# Patient Record
Sex: Female | Born: 1985 | Race: Black or African American | Hispanic: No | Marital: Single | Smoking: Former smoker
Health system: Southern US, Community
[De-identification: ages and names within clinical notes are randomized; demographics above are authoritative.]

## PROBLEM LIST (undated history)

## (undated) DIAGNOSIS — N390 Urinary tract infection, site not specified: Secondary | ICD-10-CM

## (undated) HISTORY — PX: TONSILLECTOMY: SUR1361

---
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
## 2008-04-07 ENCOUNTER — Emergency Department (HOSPITAL_COMMUNITY): Admission: EM | Admit: 2008-04-07 | Discharge: 2008-04-07 | Payer: Self-pay | Admitting: Emergency Medicine

## 2008-06-13 ENCOUNTER — Inpatient Hospital Stay (HOSPITAL_COMMUNITY): Admission: AD | Admit: 2008-06-13 | Discharge: 2008-06-13 | Payer: Self-pay | Admitting: Obstetrics

## 2008-06-25 ENCOUNTER — Ambulatory Visit (HOSPITAL_COMMUNITY): Admission: RE | Admit: 2008-06-25 | Discharge: 2008-06-25 | Payer: Self-pay | Admitting: Obstetrics & Gynecology

## 2008-09-15 ENCOUNTER — Ambulatory Visit (HOSPITAL_COMMUNITY): Admission: RE | Admit: 2008-09-15 | Discharge: 2008-09-15 | Payer: Self-pay | Admitting: Obstetrics

## 2008-10-09 ENCOUNTER — Emergency Department (HOSPITAL_COMMUNITY): Admission: EM | Admit: 2008-10-09 | Discharge: 2008-10-09 | Payer: Self-pay | Admitting: Emergency Medicine

## 2008-10-18 ENCOUNTER — Ambulatory Visit: Payer: Self-pay | Admitting: Obstetrics and Gynecology

## 2008-10-18 ENCOUNTER — Inpatient Hospital Stay (HOSPITAL_COMMUNITY): Admission: AD | Admit: 2008-10-18 | Discharge: 2008-10-18 | Payer: Self-pay | Admitting: Obstetrics & Gynecology

## 2008-10-26 ENCOUNTER — Emergency Department (HOSPITAL_COMMUNITY): Admission: EM | Admit: 2008-10-26 | Discharge: 2008-10-26 | Payer: Self-pay | Admitting: Emergency Medicine

## 2008-11-14 ENCOUNTER — Inpatient Hospital Stay (HOSPITAL_COMMUNITY): Admission: AD | Admit: 2008-11-14 | Discharge: 2008-11-15 | Payer: Self-pay | Admitting: Obstetrics

## 2008-11-15 ENCOUNTER — Inpatient Hospital Stay (HOSPITAL_COMMUNITY): Admission: AD | Admit: 2008-11-15 | Discharge: 2008-11-15 | Payer: Self-pay | Admitting: Obstetrics & Gynecology

## 2008-11-16 ENCOUNTER — Inpatient Hospital Stay (HOSPITAL_COMMUNITY): Admission: AD | Admit: 2008-11-16 | Discharge: 2008-11-18 | Payer: Self-pay | Admitting: Obstetrics & Gynecology

## 2009-05-15 ENCOUNTER — Emergency Department (HOSPITAL_COMMUNITY): Admission: EM | Admit: 2009-05-15 | Discharge: 2009-05-15 | Payer: Self-pay | Admitting: Emergency Medicine

## 2009-12-24 ENCOUNTER — Emergency Department (HOSPITAL_COMMUNITY): Admission: EM | Admit: 2009-12-24 | Discharge: 2009-12-24 | Payer: Self-pay | Admitting: Emergency Medicine

## 2010-04-04 ENCOUNTER — Inpatient Hospital Stay (HOSPITAL_COMMUNITY): Admission: AD | Admit: 2010-04-04 | Discharge: 2010-04-04 | Payer: Self-pay | Admitting: Family Medicine

## 2010-04-04 ENCOUNTER — Ambulatory Visit: Payer: Self-pay | Admitting: Obstetrics and Gynecology

## 2010-06-02 ENCOUNTER — Ambulatory Visit (HOSPITAL_COMMUNITY): Admission: RE | Admit: 2010-06-02 | Discharge: 2010-06-02 | Payer: Self-pay | Admitting: Obstetrics

## 2010-07-11 ENCOUNTER — Inpatient Hospital Stay (HOSPITAL_COMMUNITY)
Admission: AD | Admit: 2010-07-11 | Discharge: 2010-07-11 | Payer: Self-pay | Source: Home / Self Care | Attending: Obstetrics & Gynecology | Admitting: Obstetrics & Gynecology

## 2010-07-22 ENCOUNTER — Inpatient Hospital Stay (HOSPITAL_COMMUNITY)
Admission: AD | Admit: 2010-07-22 | Discharge: 2010-07-22 | Payer: Self-pay | Source: Home / Self Care | Attending: Obstetrics | Admitting: Obstetrics

## 2010-07-31 LAB — URINALYSIS, ROUTINE W REFLEX MICROSCOPIC
Bilirubin Urine: NEGATIVE
Hgb urine dipstick: NEGATIVE
Ketones, ur: >80 mg/dL — AB
Nitrite: NEGATIVE
Protein, ur: NEGATIVE mg/dL
Specific Gravity, Urine: 1.02 (ref 1.005–1.030)
Urine Glucose, Fasting: NEGATIVE mg/dL
Urobilinogen, UA: 0.2 mg/dL (ref 0.0–1.0)
pH: 7 (ref 5.0–8.0)

## 2010-09-25 LAB — URINALYSIS, ROUTINE W REFLEX MICROSCOPIC
Bilirubin Urine: NEGATIVE
Glucose, UA: NEGATIVE mg/dL
Hgb urine dipstick: NEGATIVE
Ketones, ur: NEGATIVE mg/dL
Nitrite: NEGATIVE
Protein, ur: NEGATIVE mg/dL
Specific Gravity, Urine: >1.03 — ABNORMAL HIGH (ref 1.005–1.030)
Urobilinogen, UA: 0.2 mg/dL (ref 0.0–1.0)
pH: 6.5 (ref 5.0–8.0)

## 2010-09-25 LAB — WET PREP, GENITAL
Trich, Wet Prep: NONE SEEN
Yeast Wet Prep HPF POC: NONE SEEN

## 2010-09-25 LAB — CBC
HCT: 32 % — ABNORMAL LOW (ref 36.0–46.0)
Hemoglobin: 10.8 g/dL — ABNORMAL LOW (ref 12.0–15.0)
MCH: 28.1 pg (ref 26.0–34.0)
MCHC: 33.8 g/dL (ref 30.0–36.0)
MCV: 83.3 fL (ref 78.0–100.0)
Platelets: 240 10*3/uL (ref 150–400)
RBC: 3.84 MIL/uL — ABNORMAL LOW (ref 3.87–5.11)
RDW: 13.7 % (ref 11.5–15.5)
WBC: 12.9 10*3/uL — ABNORMAL HIGH (ref 4.0–10.5)

## 2010-09-28 LAB — POCT PREGNANCY, URINE: Preg Test, Ur: POSITIVE

## 2010-09-28 LAB — CBC
HCT: 33.1 % — ABNORMAL LOW (ref 36.0–46.0)
Hemoglobin: 11 g/dL — ABNORMAL LOW (ref 12.0–15.0)
MCH: 29.1 pg (ref 26.0–34.0)
MCHC: 33.3 g/dL (ref 30.0–36.0)
MCV: 87.2 fL (ref 78.0–100.0)
Platelets: 290 10*3/uL (ref 150–400)
RBC: 3.8 MIL/uL — ABNORMAL LOW (ref 3.87–5.11)
RDW: 13 % (ref 11.5–15.5)
WBC: 11.8 10*3/uL — ABNORMAL HIGH (ref 4.0–10.5)

## 2010-09-28 LAB — URINALYSIS, ROUTINE W REFLEX MICROSCOPIC
Bilirubin Urine: NEGATIVE
Glucose, UA: NEGATIVE mg/dL
Hgb urine dipstick: NEGATIVE
Ketones, ur: 15 mg/dL — AB
Nitrite: NEGATIVE
Protein, ur: NEGATIVE mg/dL
Specific Gravity, Urine: 1.025 (ref 1.005–1.030)
Urobilinogen, UA: 0.2 mg/dL (ref 0.0–1.0)
pH: 6 (ref 5.0–8.0)

## 2010-09-28 LAB — ABO/RH: ABO/RH(D): O POS

## 2010-09-28 LAB — GC/CHLAMYDIA PROBE AMP, GENITAL
Chlamydia, DNA Probe: NEGATIVE
GC Probe Amp, Genital: NEGATIVE

## 2010-09-28 LAB — WET PREP, GENITAL
Trich, Wet Prep: NONE SEEN
Yeast Wet Prep HPF POC: NONE SEEN

## 2010-09-29 ENCOUNTER — Inpatient Hospital Stay (HOSPITAL_COMMUNITY)
Admission: AD | Admit: 2010-09-29 | Discharge: 2010-09-29 | Disposition: A | Discharge status: Home / Self Care | Payer: Medicaid Other | Source: Ambulatory Visit | Attending: Obstetrics | Admitting: Obstetrics

## 2010-09-29 DIAGNOSIS — O368190 Decreased fetal movements, unspecified trimester, not applicable or unspecified: Secondary | ICD-10-CM | POA: Insufficient documentation

## 2010-10-02 LAB — URINALYSIS, ROUTINE W REFLEX MICROSCOPIC
Bilirubin Urine: NEGATIVE
Glucose, UA: NEGATIVE mg/dL
Ketones, ur: NEGATIVE mg/dL
Nitrite: NEGATIVE
Protein, ur: 100 mg/dL — AB
Specific Gravity, Urine: 1.017 (ref 1.005–1.030)
Urobilinogen, UA: 1 mg/dL (ref 0.0–1.0)
pH: 7 (ref 5.0–8.0)

## 2010-10-02 LAB — URINE MICROSCOPIC-ADD ON

## 2010-10-02 LAB — URINE CULTURE: Colony Count: >=100000

## 2010-10-03 ENCOUNTER — Inpatient Hospital Stay (HOSPITAL_COMMUNITY)
Admission: AD | Admit: 2010-10-03 | Discharge: 2010-10-06 | DRG: 766 | Disposition: A | Discharge status: Home / Self Care | Payer: Medicaid Other | Source: Ambulatory Visit | Attending: Obstetrics | Admitting: Obstetrics

## 2010-10-03 DIAGNOSIS — O480 Post-term pregnancy: Principal | ICD-10-CM | POA: Diagnosis present

## 2010-10-03 LAB — CBC
HCT: 34.2 % — ABNORMAL LOW (ref 36.0–46.0)
Hemoglobin: 11.4 g/dL — ABNORMAL LOW (ref 12.0–15.0)
MCH: 27.6 pg (ref 26.0–34.0)
MCHC: 33.3 g/dL (ref 30.0–36.0)
MCV: 82.8 fL (ref 78.0–100.0)
Platelets: 251 10*3/uL (ref 150–400)
RBC: 4.13 MIL/uL (ref 3.87–5.11)
RDW: 14.4 % (ref 11.5–15.5)
WBC: 13.1 10*3/uL — ABNORMAL HIGH (ref 4.0–10.5)

## 2010-10-04 ENCOUNTER — Other Ambulatory Visit: Payer: Self-pay | Admitting: Obstetrics & Gynecology

## 2010-10-04 LAB — RPR: RPR Ser Ql: NONREACTIVE

## 2010-10-05 LAB — CBC
HCT: 29.7 % — ABNORMAL LOW (ref 36.0–46.0)
Hemoglobin: 9.5 g/dL — ABNORMAL LOW (ref 12.0–15.0)
MCH: 27 pg (ref 26.0–34.0)
MCHC: 32 g/dL (ref 30.0–36.0)
MCV: 84.4 fL (ref 78.0–100.0)
Platelets: 206 10*3/uL (ref 150–400)
RBC: 3.52 MIL/uL — ABNORMAL LOW (ref 3.87–5.11)
RDW: 14.6 % (ref 11.5–15.5)
WBC: 10.5 10*3/uL (ref 4.0–10.5)

## 2010-10-12 NOTE — Discharge Summary (Signed)
  NAME:  Jacqueline Haas, Jacqueline Haas NO.:  [id number]  MEDICAL RECORD NO.:  [id number]           PATIENT TYPE:  I  LOCATION:  9107                          FACILITY:  [facility]  PHYSICIAN:  Roseanna Rainbow, M.D.DATE OF BIRTH:  [birth year]  DATE OF ADMISSION:  10/03/2010 DATE OF DISCHARGE:  10/06/2010                              DISCHARGE SUMMARY   CHIEF COMPLAINT:  The patient is a 25 year old gravida 3, para 1 with an estimated date of confinement of September 26, 2010, who presents for induction of labor secondary to postdates.  PAST SURGICAL HISTORY:  Tonsils and adenoids.  PAST MEDICAL HISTORY:  She denies.  MEDICATIONS:  Prenatal vitamins, Diflucan, Tylenol.  ALLERGIES:  To MORPHINE DERIVATIVES.  SOCIAL HISTORY:  Current tobacco use.  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  FAMILY HISTORY:  Depression, hypertension.  PHYSICAL EXAMINATION:  VITAL SIGNS:  Stable and afebrile. LUNGS:  Clear to auscultation bilaterally. HEART:  Regular rate and rhythm. ABDOMEN:  Gravid and nontender.  Cervix long and closed.  Vertex at -3 station.  ASSESSMENT AND PLAN:  Intrauterine pregnancy at 41 weeks.  PLAN:  Admission, two-stage induction of labor.  HOSPITAL COURSE:  The patient was admitted.  The fetal heart tracing evolved into a category II fetal heart tracing.  The fetal heart tracing had persistent suspicious changes that were refractory to intrauterine resuscitative efforts.  The patient was in latent labor and delivery was felt to be remote.  The decision was made to proceed with a primary cesarean delivery.  Please see the dictated operative summary for findings.  On postoperative day #1, hemoglobin was 9.5 and preoperative hemoglobin was 11.4.  The patient was hemodynamically stable.  The remainder of her hospital course was uneventful.  She was discharged to home on postoperative day #2.  DISCHARGE DIAGNOSES:  Postdates pregnancy intrauterine pregnancy  at 41 weeks, suspicious fetal heart tracing, category 2.  PROCEDURE:  Cesarean delivery.  CONDITION:  Stable.  DIET:  Regular.  ACTIVITY:  Pelvic rest, progressive activity.  MEDICATIONS:  Percocet 5/325 one to two tablets every 6 hours as needed. Over-the-counter ibuprofen, stool softener ferrous sulfate.  DISPOSITION:  The patient was to follow up in the office in several days for staple removal.     Roseanna Rainbow, M.D.     LAJ/MEDQ  D:  10/06/2010  T:  10/07/2010  Job:  [id number]  Electronically Signed by Antionette Char M.D. on 10/12/2010 07:11:46 PM

## 2010-10-12 NOTE — Op Note (Signed)
NAME:  Jacqueline Haas, Jacqueline Haas NO.:  [id number]  MEDICAL RECORD NO.:  [id number]         PATIENT TYPE:  WINP  LOCATION:  107                           [facility]:  [facility]  PHYSICIAN:  Roseanna Rainbow, M.D.DATE OF BIRTH:  04-16-1986  DATE OF PROCEDURE:  10/04/2010 DATE OF DISCHARGE:                              OPERATIVE REPORT   PREOPERATIVE DIAGNOSES:  Intrauterine pregnancy at 41 weeks, latent labor.  Category 2 fetal heart tracing.  POSTOPERATIVE DIAGNOSIS:  Intrauterine pregnancy at 41 weeks, latent labor.  Category 2 fetal heart tracing.  PROCEDURE:  Primary low uterine flap elliptical cesarean delivery via a transverse incision.  SURGEON:  Roseanna Rainbow, MD  ANESTHESIA:  Epidural.  FINDINGS:  Live born female, vertex presentation, Apgars 8 and 9 at 1 and 5 minutes respectively, umbilical artery pH 7.29, birth weight 8 pounds 3 ounces.  PATHOLOGY:  Placenta.  ESTIMATED BLOOD LOSS:  600 mL.  COMPLICATIONS:  None.  PROCEDURE:  The patient was taken to the operating room with an IV running.  An epidural catheter was in situ.  She was placed in the dorsal supine position with a leftward tilt and prepped and draped in the usual sterile fashion.  After a time-out had been completed, a transverse incision was made with the scalpel.  This was carried down to the underlying fascia with Bovie.  The fascia was nicked in the midline. The fascial incision was then extended bilaterally with curved Mayo scissors.  The superior aspect of the fascial incision was tented up and the underlying rectus muscle was dissected off.  The inferior aspect of the fascial incision was manipulated in a similar fashion.  The rectus muscles were separated in the midline.  The parietal peritoneum was tented up and entered sharply.  This incision was then extended superiorly and inferiorly with good visualization of the bladder.  The bladder blade was then placed.  The  vesicouterine peritoneum was tented up and entered sharply.  This incision was then extended bilaterally and the bladder flap created bluntly.  The bladder blade was then replaced. The lower uterine segment was incised in a transverse fashion with a scalpel.  This incision was then extended bluntly.  The infant's head was delivered atraumatically.  The cord was clamped and cut.  The infant was handed off to the awaiting neonatologist.  The placenta was then removed.  The intrauterine cavity was evacuated of any remaining amniotic fluid clots and debris with moist laparotomy sponge.  The uterine incision was then reapproximated in a running interlocking fashion using a suture of 0 Monocryl.  The second imbricating layer of the same suture was then placed for adequate hemostasis.  The paracolic gutters were then irrigated.  The parietal peritoneum was then reapproximated in a running fashion using a suture of 2-0 Vicryl.  The fascia was reapproximated in a running fashion using 2 running sutures of 0 Vicryl tied in the midline.  The skin was closed with staples.  At the closure of the procedure, the instrument and pack counts were said to be correct x2.  The patient was taken to the PACU in awake and stable  condition.     Roseanna Rainbow, M.D.     Judee Clara  D:  10/04/2010  T:  10/05/2010  Job:  [id number]  Electronically Signed by Antionette Char M.D. on 10/12/2010 07:11:32 PM

## 2010-10-24 LAB — CBC
HCT: 30.5 % — ABNORMAL LOW (ref 36.0–46.0)
Hemoglobin: 10.3 g/dL — ABNORMAL LOW (ref 12.0–15.0)
Hemoglobin: 11.8 g/dL — ABNORMAL LOW (ref 12.0–15.0)
RBC: 3.53 MIL/uL — ABNORMAL LOW (ref 3.87–5.11)
RDW: 15.5 % (ref 11.5–15.5)
WBC: 11.1 10*3/uL — ABNORMAL HIGH (ref 4.0–10.5)
WBC: 12.3 10*3/uL — ABNORMAL HIGH (ref 4.0–10.5)

## 2010-10-24 LAB — LACTATE DEHYDROGENASE: LDH: 128 U/L (ref 94–250)

## 2010-10-24 LAB — COMPREHENSIVE METABOLIC PANEL
ALT: 15 U/L (ref 0–35)
Albumin: 2.6 g/dL — ABNORMAL LOW (ref 3.5–5.2)
Alkaline Phosphatase: 117 U/L (ref 39–117)
Chloride: 106 mEq/L (ref 96–112)
Glucose, Bld: 80 mg/dL (ref 70–99)
Potassium: 3.9 mEq/L (ref 3.5–5.1)
Sodium: 134 mEq/L — ABNORMAL LOW (ref 135–145)
Total Bilirubin: 0.4 mg/dL (ref 0.3–1.2)
Total Protein: 5.8 g/dL — ABNORMAL LOW (ref 6.0–8.3)

## 2010-10-24 LAB — WET PREP, GENITAL

## 2010-10-24 LAB — CCBB MATERNAL DONOR DRAW

## 2010-10-25 LAB — URINALYSIS, ROUTINE W REFLEX MICROSCOPIC
Bilirubin Urine: NEGATIVE
Hgb urine dipstick: NEGATIVE
Nitrite: NEGATIVE
Specific Gravity, Urine: 1.02 (ref 1.005–1.030)
pH: 6.5 (ref 5.0–8.0)

## 2010-10-25 LAB — URINE MICROSCOPIC-ADD ON

## 2010-10-25 LAB — CBC
Hemoglobin: 11.2 g/dL — ABNORMAL LOW (ref 12.0–15.0)
RBC: 3.95 MIL/uL (ref 3.87–5.11)
RDW: 14.1 % (ref 11.5–15.5)

## 2010-11-28 NOTE — H&P (Signed)
NAME:  Jacqueline Haas, Jacqueline Haas NO.:  [id number]   MEDICAL RECORD NO.:  [id number]          PATIENT TYPE:  INP   LOCATION:  9136                          [facility]ACILITY:  [facility]   PHYSICIAN:  Roseanna Rainbow, M.D.DATE OF BIRTH:  02/01/1986   DATE OF ADMISSION:  11/16/2008  DATE OF DISCHARGE:                              HISTORY & PHYSICAL   CHIEF COMPLAINT:  The patient is a 25 year old, para 0 with an estimated  date of confinement of Nov 13, 2008, with an intrauterine pregnancy of  40+ weeks, complaining of contractions.   HISTORY OF PRESENT ILLNESS:  Please see the above.  She denies ruptured  membranes.   ALLERGIES:  MORPHINE.   MEDICATIONS:  Prenatal vitamins.   PRENATAL LABS:  Chlamydia probe negative.  Urine culture and  sensitivity, insignificant growth.  One-hour GCT 88, GC probe negative.  Hematocrit 32.8, hemoglobin 10.6, HIV nonreactive.  Quad screen  negative.  Platelets 298,000.  Blood type is O+, antibody screen  negative.  RPR nonreactive, rubella immune, sickle cell negative.  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   PAST SURGICAL HISTORY:  There is a history of a voluntary termination of  pregnancy.   PAST GYN HISTORY:  Noncontributory.   PAST MEDICAL HISTORY:  No significant history of medical diseases.   PAST SURGICAL HISTORY:  Tonsils and adenoids.   SOCIAL HISTORY:  She is a Electrical engineer.  She is single, does not give  any significant history of alcohol usage, has no significant smoking  history, denies illicit drug use.   FAMILY HISTORY:  Remarkable for hypertension.   REVIEW OF SYSTEMS:  GU:  Please see the above.  NEUROLOGIC:  She denies  headache, visual disturbances.  GI:  She denies any nausea, vomiting,  epigastric pain.  PULMONARY:  She denies any shortness of breath.   PHYSICAL EXAMINATION:  VITAL SIGNS:  Blood pressure is 110s-140s/60s-  80s.  Fetal heart tracing reassuring.  Tocodynamometer,  contractions  every 2-4 minutes.  Sterile vaginal exam per the ER, the cervix is 3 cm  dilated, 70% effaced.   ASSESSMENT:  Intrauterine pregnancy at 40+ weeks, labile blood  pressures, rule out gestational hypertension, early labor, fetal heart  tracing consistent with fetal well-being.   PLAN:  Admission.  We will check a PIH panel.  Expectant management.      Roseanna Rainbow, M.D.  Electronically Signed     LAJ/MEDQ  D:  11/16/2008  T:  11/17/2008  Job:  [id number]

## 2010-12-01 NOTE — Op Note (Signed)
[facility]  Patient:    Jacqueline Haas, Jacqueline Haas                   MRN: [id number] Proc. Date: 12/19/00 Adm. Date:  [id number] Disc. Date: [id number] Attending:  Carmelina Peal                           Operative Report  DATE OF BIRTH:  12/08/1985  HISTORY OF PRESENT ILLNESS:  Jacqueline Haas presented to the [facility] Emergency Room. The patient is a 25 year old black female who fell while playing. She denies loss of consciousness or other injuries except those to her left ankle and leg region. She was seen and appropriately evaluated by the ER staff who referred her to me secondary to her displaced fracture. The patient at the present time is noted to be in pain about the left leg. She is laying in the prone position and denies other complaints. I have discussed her findings with her mother and herself at length.  ALLERGIES:  None.  MEDICATIONS:  None.  PAST SURGICAL HISTORY:  Ear and throat surgery.  PAST MEDICAL HISTORY:  None.  SOCIAL HISTORY:  She is a healthy 25 year old.  PHYSICAL EXAMINATION:  GENERAL:  Black female somewhat obese, alert and oriented, in no acute distress. The patient has normal neurovascular examination in the lower extremities. There are no skin breaks, evidence of compartment syndrome or DVT. The left lower extremity has soft tissue swelling and some deformity about the ankle region. Knee, popliteal fossa and thighs are nontender bilaterally. The patient does not have any L4 through S1 sensation abnormalities. She can move her toes bilaterally. I coaxed her into the supine position and have evaluated her at length. X-rays show a displaced Salter-Harris fracture of the tibia. This is a Salter-Harris type 2 to 4 fracture but does not appear to represent an obvious triplane type variety.  IMPRESSION:  Displaced physeal fracture about the left ankle.  PLAN:  I have verbally consented she and her mother and writtenly  consented them for closed reduction under conscious sedation. With this in mind, they desire to proceed.  DESCRIPTION OF PROCEDURE:  The patient was given conscious sedation in the form of morphine and Valium. She was also given some additional Demerol IV. Following this, she underwent a manipulative reduction. Once the manipulative reduction was accomplished, the patient then had adjustments made accordingly based upon radiographs. I performed a set of reduction films and then remanipulated the leg to achieve a final reduction that showed excellent alignment in the AP, lateral and mortis planes. Once the final reduction was noted to be excellent, the patient had application of a long leg ______ cast to my satisfaction without difficulty, well molded. She tolerated this well and was neurovascularly intact after this was performed.  IMPRESSION:  Status post closed reduction left ankle Salter-Harris/physeal fracture.  PLAN:  I asked her to ice, elevate, be nonweightbearing, return to see me in 7-10 days for follow-up x-ray and notify me should any problems occur. We have dispensed Adaptic aids for walking today and gone over all issues including neurovascular precautions, etc. She was also given pain medicine appropriate to her needs. If there are any problems, questions, or concerns, she will notify me otherwise I will look forward to seeing her in 7-10 days for follow-up x-rays. At the time of discharge, she was awake, alert and oriented and comfortable. DD:  12/19/00 TD:  12/20/00 Job: [id number]

## 2011-04-16 LAB — URINALYSIS, ROUTINE W REFLEX MICROSCOPIC
Hgb urine dipstick: NEGATIVE
Specific Gravity, Urine: 1.023
Urobilinogen, UA: 0.2

## 2011-04-16 LAB — POCT PREGNANCY, URINE: Preg Test, Ur: POSITIVE

## 2011-04-16 LAB — URINE MICROSCOPIC-ADD ON

## 2011-04-17 LAB — URINALYSIS, ROUTINE W REFLEX MICROSCOPIC
Bilirubin Urine: NEGATIVE
Glucose, UA: NEGATIVE mg/dL
Hgb urine dipstick: NEGATIVE
Ketones, ur: NEGATIVE mg/dL
Nitrite: NEGATIVE
pH: 7.5 (ref 5.0–8.0)

## 2011-04-17 LAB — WET PREP, GENITAL
Clue Cells Wet Prep HPF POC: NONE SEEN
Trich, Wet Prep: NONE SEEN
Yeast Wet Prep HPF POC: NONE SEEN

## 2011-04-17 LAB — GC/CHLAMYDIA PROBE AMP, GENITAL: Chlamydia, DNA Probe: NEGATIVE

## 2011-06-03 ENCOUNTER — Emergency Department (HOSPITAL_COMMUNITY)
Admission: EM | Admit: 2011-06-03 | Discharge: 2011-06-03 | Discharge status: Against Medical Advice | Payer: Medicaid Other | Attending: Emergency Medicine | Admitting: Emergency Medicine

## 2011-06-03 ENCOUNTER — Encounter: Payer: Self-pay | Admitting: Emergency Medicine

## 2011-06-03 DIAGNOSIS — K529 Noninfective gastroenteritis and colitis, unspecified: Secondary | ICD-10-CM

## 2011-06-03 DIAGNOSIS — R197 Diarrhea, unspecified: Secondary | ICD-10-CM | POA: Insufficient documentation

## 2011-06-03 DIAGNOSIS — K5289 Other specified noninfective gastroenteritis and colitis: Secondary | ICD-10-CM | POA: Insufficient documentation

## 2011-06-03 DIAGNOSIS — R1110 Vomiting, unspecified: Secondary | ICD-10-CM | POA: Insufficient documentation

## 2011-06-03 DIAGNOSIS — IMO0001 Reserved for inherently not codable concepts without codable children: Secondary | ICD-10-CM | POA: Insufficient documentation

## 2011-06-03 DIAGNOSIS — R110 Nausea: Secondary | ICD-10-CM | POA: Insufficient documentation

## 2011-06-03 LAB — URINALYSIS, ROUTINE W REFLEX MICROSCOPIC
Bilirubin Urine: NEGATIVE
Ketones, ur: NEGATIVE mg/dL
Nitrite: NEGATIVE
Protein, ur: NEGATIVE mg/dL
Urobilinogen, UA: 0.2 mg/dL (ref 0.0–1.0)

## 2011-06-03 LAB — URINE MICROSCOPIC-ADD ON

## 2011-06-03 MED ORDER — SODIUM CHLORIDE 0.9 % IV BOLUS (SEPSIS): 1000.0000 mL | Freq: Once | INTRAVENOUS | Status: DC

## 2011-06-03 MED ORDER — ONDANSETRON HCL 4 MG/2ML IJ SOLN
4.0000 mg | Freq: Once | INTRAMUSCULAR | Status: DC
  Filled 2011-06-03 (×2): qty 2

## 2011-06-03 NOTE — ED Notes (Signed)
PA notified pt not in room.

## 2011-06-03 NOTE — ED Notes (Signed)
Pt expressing that she needs to use the restroom and does not want to use the hospital bathroom.  This rn tod pt i would move her to a room with a private bathroom.  Pt states she would appreciate that.

## 2011-06-03 NOTE — ED Notes (Signed)
This rn in pt room to perform iv and blood draw. Pt was previously in the the bathroom.  Pt no longer in room.  Er tech states pt was getting anxious and stating she did not want to be here.  Pt may have left.

## 2011-06-03 NOTE — ED Notes (Signed)
Pt states she woke up last night around 0200 and began having nausea and diarrhea.  Pt states she ate "fridays" last night and that was the last thing she has eaten.  Pt reports 2 episodes of vomiting and 5 episodes of diarrhea

## 2011-06-03 NOTE — ED Provider Notes (Signed)
History     CSN: [id number] Arrival date & time: 06/03/2011  7:41 AM   First MD Initiated Contact with Patient 06/03/11 [phone]      Chief Complaint  Patient presents with  . Nausea  . Emesis  . Diarrhea    (Consider location/radiation/quality/duration/timing/severity/associated sxs/prior treatment) HPI Comments: Patient here after having eaten late last night - she reports that she awoke at 0200 with nausea and diarrhea, reports 5 episodes of watery diarrhea since then, states has vomited twice NBNB vomit.  Reports crampy abdominal pain -none now.  Symptoms subsided after ODT zofran.  Patient is a 25 y.o. female presenting with vomiting and diarrhea. The history is provided by the patient. No language interpreter was used.  Emesis  This is a new problem. The current episode started 3 to 5 hours ago. The problem occurs 2 to 4 times per day. The problem has been gradually improving. The emesis has an appearance of stomach contents. There has been no fever. Associated symptoms include diarrhea, myalgias and sweats. Pertinent negatives include no chills, no cough, no fever, no headaches and no URI.  Diarrhea The primary symptoms include vomiting, diarrhea and myalgias. Primary symptoms do not include fever or dysuria.  The illness does not include chills.    No past medical history on file.  No past surgical history on file.  No family history on file.  History  Substance Use Topics  . Smoking status: Not on file  . Smokeless tobacco: Not on file  . Alcohol Use: Not on file    OB History    Grav Para Term Preterm Abortions TAB SAB Ect Mult Living                  Review of Systems  Constitutional: Negative for fever and chills.  Respiratory: Negative for cough.   Gastrointestinal: Positive for vomiting and diarrhea.  Genitourinary: Negative for dysuria, vaginal bleeding, vaginal pain and menstrual problem.  Musculoskeletal: Positive for myalgias.  Neurological: Negative  for headaches.  All other systems reviewed and are negative.    Allergies  Morphine and related  Home Medications   Current Outpatient Rx  Name Route Sig Dispense Refill  . ETONOGESTREL 68 MG [place] IMPL Subcutaneous Inject 68 mg into the skin once. Change every 3 years     . ONDANSETRON HCL 4 MG PO TABS Oral Take 4 mg by mouth every 12 (twelve) hours as needed. Nausea and vomitting      BP 118/72  Pulse 85  Temp(Src) 97.7 F (36.5 C) (Oral)  Resp 16  SpO2 99%  Physical Exam  Nursing note and vitals reviewed. Constitutional: She is oriented to person, place, and time. She appears well-developed and well-nourished. No distress.  HENT:  Head: Normocephalic and atraumatic.  Right Ear: External ear normal.  Left Ear: External ear normal.  Mouth/Throat: Oropharynx is clear and moist.  Eyes: Pupils are equal, round, and reactive to light.  Neck: Normal range of motion. Neck supple.  Cardiovascular: Normal rate and regular rhythm.   Pulmonary/Chest: Effort normal and breath sounds normal. No respiratory distress. She has no wheezes.  Abdominal: Soft. Bowel sounds are normal. She exhibits no distension. There is no tenderness. There is no rebound and no guarding.  Musculoskeletal: Normal range of motion.  Neurological: She is alert and oriented to person, place, and time.  Skin: Skin is warm and dry.  Psychiatric: She has a normal mood and affect. Her behavior is normal. Judgment and thought content  normal.    ED Course  Procedures (including critical care time)   Labs Reviewed  CBC  DIFFERENTIAL  POCT PREGNANCY, URINE  URINALYSIS, ROUTINE W REFLEX MICROSCOPIC  I-STAT, CHEM 8   No results found.   Gastroenteritis   MDM  Believe this to be gastroenteritis - will give a liter of fluids, check basic labs, patient has implanon but would like pregnancy test taken.        Went back to assess the patient and was told by nurse that the patient had eloped without  informing staff  Scarlette Calico C. [place], [place] 06/03/11 1600

## 2011-06-03 NOTE — ED Provider Notes (Signed)
Medical screening examination/treatment/procedure(s) were performed by non-physician practitioner and as supervising physician I was immediately available for consultation/collaboration.  Hurman Horn, MD 06/03/11 [phone]

## 2011-06-03 NOTE — ED Notes (Signed)
Pt still not in room.  Talked to PA, she states she will discharge her as  AMA

## 2011-06-03 NOTE — ED Notes (Signed)
Pt still not in room. 

## 2011-06-11 ENCOUNTER — Encounter (HOSPITAL_COMMUNITY): Payer: Self-pay

## 2011-06-11 ENCOUNTER — Emergency Department (INDEPENDENT_AMBULATORY_CARE_PROVIDER_SITE_OTHER)
Admission: EM | Admit: 2011-06-11 | Discharge: 2011-06-11 | Disposition: A | Discharge status: Home / Self Care | Payer: Medicaid Other | Source: Home / Self Care

## 2011-06-11 ENCOUNTER — Emergency Department (HOSPITAL_COMMUNITY)
Admission: EM | Admit: 2011-06-11 | Discharge: 2011-06-11 | Discharge status: Against Medical Advice | Payer: Medicaid Other | Attending: Emergency Medicine | Admitting: Emergency Medicine

## 2011-06-11 DIAGNOSIS — Z0389 Encounter for observation for other suspected diseases and conditions ruled out: Secondary | ICD-10-CM | POA: Insufficient documentation

## 2011-06-11 DIAGNOSIS — J111 Influenza due to unidentified influenza virus with other respiratory manifestations: Secondary | ICD-10-CM

## 2011-06-11 DIAGNOSIS — J988 Other specified respiratory disorders: Secondary | ICD-10-CM | POA: Insufficient documentation

## 2011-06-11 MED ORDER — OSELTAMIVIR PHOSPHATE 75 MG PO CAPS: 75.0000 mg | ORAL_CAPSULE | Freq: Two times a day (BID) | ORAL | Status: AC

## 2011-06-11 MED ORDER — IBUPROFEN 800 MG PO TABS: 800.0000 mg | ORAL_TABLET | Freq: Three times a day (TID) | ORAL | Status: AC

## 2011-06-11 NOTE — ED Notes (Signed)
C/o onset last PM, general body aches, fever, chills

## 2011-06-11 NOTE — ED Provider Notes (Signed)
Medical screening examination/treatment/procedure(s) were performed by non-physician practitioner and as supervising physician I was immediately available for consultation/collaboration.  Raynald Blend, MD 06/11/11 1327

## 2011-06-11 NOTE — ED Notes (Signed)
Pt stated she was going to leave and go to Urgent Care because she did was not going to wait. Triage complete. Bed assigned filled by EMS who took priority..the patient told she would be moved back to a room w/in 10 mins...still opted to leave.

## 2011-06-11 NOTE — ED Provider Notes (Signed)
History     CSN: [id number] Arrival date & time: 06/11/2011  9:34 AM   None     Chief Complaint  Patient presents with  . Influenza    (Consider location/radiation/quality/duration/timing/severity/associated sxs/prior treatment) HPI Comments: Sudden onset last night of chills, fever, body aches, nonproductive cough and sore throat. Temp 101+ at home last night. Took one dose of otc cough medication last night with a little help of cough. Has not taken anything for fever. No sick exposures. Denies abd pain, N/V/D.   Patient is a 25 y.o. female presenting with flu symptoms. The history is provided by the patient.  Influenza This is a new problem. The current episode started 12 to 24 hours ago. The problem occurs constantly. The problem has not changed since onset.Pertinent negatives include no chest pain, no abdominal pain, no headaches and no shortness of breath. The symptoms are aggravated by nothing. The symptoms are relieved by nothing. Treatments tried: Cough medication. The treatment provided mild relief.    History reviewed. No pertinent past medical history.  Past Surgical History  Procedure Date  . Tonsillectomy   . Cesarean section     History reviewed. No pertinent family history.  History  Substance Use Topics  . Smoking status: Current Everyday Smoker  . Smokeless tobacco: Not on file  . Alcohol Use: Yes     occasional    OB History    Grav Para Term Preterm Abortions TAB SAB Ect Mult Living                  Review of Systems  Constitutional: Positive for fever, chills, appetite change and fatigue.  HENT: Positive for sore throat and rhinorrhea. Negative for ear pain, congestion, postnasal drip and sinus pressure.   Respiratory: Positive for cough. Negative for chest tightness, shortness of breath and wheezing.   Cardiovascular: Negative for chest pain.  Gastrointestinal: Negative for nausea, vomiting, abdominal pain and diarrhea.  Genitourinary:  Negative for dysuria, urgency and frequency.  Musculoskeletal: Positive for myalgias.  Neurological: Negative for light-headedness and headaches.    Allergies  Morphine and related  Home Medications   Current Outpatient Rx  Name Route Sig Dispense Refill  . IBUPROFEN 800 MG PO TABS Oral Take 1 tablet (800 mg total) by mouth 3 (three) times daily. 15 tablet 0  . OSELTAMIVIR PHOSPHATE 75 MG PO CAPS Oral Take 1 capsule (75 mg total) by mouth 2 (two) times daily. 10 capsule 0    BP 126/78  Pulse 111  Temp 102.2 F (39 C)  Resp 18  SpO2 99%  Physical Exam  Nursing note and vitals reviewed. Constitutional: She is oriented to person, place, and time. She appears well-developed and well-nourished.       Appears ill, but NAD  HENT:  Head: Normocephalic and atraumatic.  Right Ear: Tympanic membrane, external ear and ear canal normal.  Left Ear: Tympanic membrane, external ear and ear canal normal.  Nose: Nose normal.  Mouth/Throat: Uvula is midline, oropharynx is clear and moist and mucous membranes are normal. No oropharyngeal exudate.  Neck: Neck supple.  Cardiovascular: Normal rate, regular rhythm and normal heart sounds.   Pulmonary/Chest: Breath sounds normal. No respiratory distress.  Abdominal: Soft. Bowel sounds are normal. She exhibits no distension and no mass. There is no tenderness.  Lymphadenopathy:    She has no cervical adenopathy.  Neurological: She is alert and oriented to person, place, and time.  Skin: Skin is warm and dry.  Psychiatric: She  has a normal mood and affect.    ED Course  Procedures (including critical care time)  Labs Reviewed - No data to display No results found.   1. Influenza       MDM          Melody Comas, PA 06/11/11 1100

## 2011-06-11 NOTE — ED Notes (Signed)
Pt complains of body aches, low grade temp, cough

## 2014-03-16 ENCOUNTER — Encounter: Payer: Self-pay | Admitting: Obstetrics

## 2014-03-16 ENCOUNTER — Ambulatory Visit (INDEPENDENT_AMBULATORY_CARE_PROVIDER_SITE_OTHER): Payer: BC Managed Care – PPO | Admitting: Obstetrics

## 2014-03-16 ENCOUNTER — Other Ambulatory Visit: Payer: Self-pay | Admitting: Obstetrics

## 2014-03-16 VITALS — BP 150/94 | Temp 99.0°F | Ht 67.0 in | Wt 236.0 lb

## 2014-03-16 DIAGNOSIS — Z3202 Encounter for pregnancy test, result negative: Secondary | ICD-10-CM

## 2014-03-16 DIAGNOSIS — Z3046 Encounter for surveillance of implantable subdermal contraceptive: Secondary | ICD-10-CM

## 2014-03-16 DIAGNOSIS — Z30017 Encounter for initial prescription of implantable subdermal contraceptive: Secondary | ICD-10-CM

## 2014-03-17 ENCOUNTER — Encounter: Payer: Self-pay | Admitting: Obstetrics

## 2014-03-17 NOTE — Progress Notes (Signed)
NEXPLANON REMOVAL NOTE  Date of LMP:   unknown  Contraception used: *Nexplanon   Indications:  The patient desires removal of Implanon.  She understands risks, benefits, and alternatives to Implanon and would like to proceed.  Anesthesia:   Lidocaine 1% plain.  Procedure:  A time-out was performed confirming the procedure and the patient's allergy status.  Complications: None                      The rod was palpated and the area was sterilely prepped.  The area beneath the distal tip was anesthetized with 1% xylocaine and the skin incised                       Over the tip and the tip was exposed, grasped with forcep and removed intact.  A single suture of 4-0 Vicryl was used to close incision.  Steri strip                       And a bandage applied and the arm was wrapped with gauze bandage.  The patient tolerated well.  Instructions:  The patient was instructed to remove the dressing in 24 hours and that some bruising is to be expected.  She was advised to use over the counter analgesics as needed for any pain at the site.  She is to keep the area dry for 24 hours and to call if her hand or arm becomes cold, numb, or blue.  Return visit:  Return in 2 weeks

## 2014-03-17 NOTE — Progress Notes (Signed)
Nexplanon Procedure Note   PRE-OP DIAGNOSIS: desired long-term, reversible contraception  POST-OP DIAGNOSIS: Same  PROCEDURE: Nexplanon  placement Performing Provider: Coral Ceo MD  Patient education prior to procedure, explained risk, benefits of Nexplanon, reviewed alternative options. Patient reported understanding. Gave consent to continue with procedure.   PROCEDURE:  Pregnancy Text :  Negative Site (check):      left arm         Sterile Preparation:   Betadinex3 Lot # [id number] / [id number] Expiration Date 12 / 2017  Insertion site was selected 8 - 10 cm from medial epicondyle and marked along with guiding site using sterile marker. Procedure area was prepped and draped in a sterile fashion. 1% Lidocaine 1.5 ml given prior to procedure. Nexplanon  was inserted subcutaneously.Needle was removed from the insertion site. Nexplanon capsule was palpated by provider and patient to assure satisfactory placement. Dressing applied.  Followup: The patient tolerated the procedure well without complications.  Standard post-procedure care is explained and return precautions are given.  Coral Ceo MD

## 2014-03-19 LAB — POCT URINE PREGNANCY: PREG TEST UR: NEGATIVE

## 2014-03-19 NOTE — Addendum Note (Signed)
Addended by: Odessa Fleming on: 03/19/2014 10:48 AM   Modules accepted: Orders

## 2014-03-30 ENCOUNTER — Other Ambulatory Visit: Payer: Self-pay | Admitting: Obstetrics

## 2014-03-30 ENCOUNTER — Encounter: Payer: Self-pay | Admitting: Obstetrics

## 2014-03-30 ENCOUNTER — Ambulatory Visit (INDEPENDENT_AMBULATORY_CARE_PROVIDER_SITE_OTHER): Payer: BC Managed Care – PPO | Admitting: Obstetrics

## 2014-03-30 VITALS — BP 118/75 | HR 71 | Temp 98.3°F | Ht 67.0 in | Wt 239.8 lb

## 2014-03-30 DIAGNOSIS — L0293 Carbuncle, unspecified: Secondary | ICD-10-CM

## 2014-03-30 DIAGNOSIS — L0292 Furuncle, unspecified: Secondary | ICD-10-CM | POA: Insufficient documentation

## 2014-03-30 DIAGNOSIS — Z01419 Encounter for gynecological examination (general) (routine) without abnormal findings: Secondary | ICD-10-CM

## 2014-03-30 DIAGNOSIS — B3731 Acute candidiasis of vulva and vagina: Secondary | ICD-10-CM

## 2014-03-30 DIAGNOSIS — B373 Candidiasis of vulva and vagina: Secondary | ICD-10-CM | POA: Insufficient documentation

## 2014-03-30 MED ORDER — AMOXICILLIN-POT CLAVULANATE 875-125 MG PO TABS: 1.0000 | ORAL_TABLET | Freq: Two times a day (BID) | ORAL | Status: DC

## 2014-03-30 MED ORDER — FLUCONAZOLE 150 MG PO TABS: 150.0000 mg | ORAL_TABLET | Freq: Once | ORAL | Status: DC

## 2014-03-30 NOTE — Progress Notes (Signed)
Subjective:     Jacqueline Haas is a 28 y.o. female here for a routine exam.  Current complaints: none.    Personal health questionnaire:  Is patient Ashkenazi Jewish, have a family history of breast and/or ovarian cancer: no Is there a family history of uterine cancer diagnosed at age < 62, gastrointestinal cancer, urinary tract cancer, family member who is a Personnel officer syndrome-associated carrier: no Is the patient overweight and hypertensive, family history of diabetes, personal history of gestational diabetes or PCOS: no Is patient over 60, have PCOS,  family history of premature CHD under age 56, diabetes, smoke, have hypertension or peripheral artery disease:  no At any time, has a partner hit, kicked or otherwise hurt or frightened you?: no Over the past 2 weeks, have you felt down, depressed or hopeless?: no Over the past 2 weeks, have you felt little interest or pleasure in doing things?:no   Gynecologic History Patient's last menstrual period was 03/16/2014. Contraception: Nexplanon Last Pap: 2014. Results were: normal Last mammogram: n/a. Results were: n/a  Obstetric History OB History  Gravida Para Term Preterm AB SAB TAB Ectopic Multiple Living  # Outcome Date GA Lbr Len/2nd Weight Sex Delivery Anes PTL Lv  3 TRM         Y  2 TRM         Y  1 TAB         N      History reviewed. No pertinent past medical history.  History reviewed. No pertinent past surgical history.  Current outpatient prescriptions:etonogestrel (IMPLANON) 68 MG IMPL implant, Inject 68 mg into the skin once. Change every 3 years , Disp: , Rfl: ;  amoxicillin-clavulanate (AUGMENTIN) 875-125 MG per tablet, Take 1 tablet by mouth 2 (two) times daily., Disp: 28 tablet, Rfl: 4;  fluconazole (DIFLUCAN) 150 MG tablet, Take 1 tablet (150 mg total) by mouth once., Disp: 1 tablet, Rfl: 2 Allergies  Allergen Reactions  . Morphine And Related Hives    History  Substance Use Topics  .  Smoking status: Current Some Day Smoker  . Smokeless tobacco: Never Used  . Alcohol Use: Yes     Comment: Occasionally     History reviewed. No pertinent family history.    Review of Systems  Constitutional: negative for fatigue and weight loss Respiratory: negative for cough and wheezing Cardiovascular: negative for chest pain, fatigue and palpitations Gastrointestinal: negative for abdominal pain and change in bowel habits Musculoskeletal:negative for myalgias Neurological: negative for gait problems and tremors Behavioral/Psych: negative for abusive relationship, depression Endocrine: negative for temperature intolerance   Genitourinary:negative for abnormal menstrual periods, genital lesions, hot flashes, sexual problems and vaginal discharge Integument/breast: negative for breast lump, breast tenderness, nipple discharge and skin lesion(s)    Objective:       BP 118/75  Pulse 71  Temp(Src) 98.3 F (36.8 C)  Ht  (1.702 m)  Wt 239 lb 12.8 oz (108.773 kg)  BMI 37.55 kg/m2  LMP 03/16/2014 General:   alert  Skin:   no rash or abnormalities  Lungs:   clear to auscultation bilaterally  Heart:   regular rate and rhythm, S1, S2 normal, no murmur, click, rub or gallop  Breasts:   normal without suspicious masses, skin or nipple changes or axillary nodes  Abdomen:  normal findings: no organomegaly, soft, non-tender and no hernia  Pelvis:  External genitalia: normal general appearance Urinary  system: urethral meatus normal and bladder without fullness, nontender Vaginal: normal without tenderness, induration or masses Cervix: normal appearance Adnexa: normal bimanual exam Uterus: anteverted and non-tender, normal size   Lab Review Urine pregnancy test Labs reviewed yes Radiologic studies reviewed no    Assessment:    Healthy female exam.   History of boils, chronic.  Axillary and groin locations.   Plan:    Education reviewed: calcium supplements, safe  sex/STD prevention, self breast exams and prevention and management of boils.. Contraception: Nexplanon. Follow up in: 1 year. Diflucan Rx for candida prophylaxis.   Meds ordered this encounter  Medications  . amoxicillin-clavulanate (AUGMENTIN) 875-125 MG per tablet    Sig: Take 1 tablet by mouth 2 (two) times daily.    Dispense:  28 tablet    Refill:  4  . fluconazole (DIFLUCAN) 150 MG tablet    Sig: Take 1 tablet (150 mg total) by mouth once.    Dispense:  1 tablet    Refill:  2   No orders of the defined types were placed in this encounter.

## 2014-03-30 NOTE — Addendum Note (Signed)
Addended by: Marya Landry D on: 03/30/2014 04:42 PM   Modules accepted: Orders

## 2014-03-31 LAB — WET PREP BY MOLECULAR PROBE
CANDIDA SPECIES: NEGATIVE
Gardnerella vaginalis: POSITIVE — AB
TRICHOMONAS VAG: NEGATIVE

## 2014-03-31 LAB — PAP IG W/ RFLX HPV ASCU

## 2014-03-31 LAB — GC/CHLAMYDIA PROBE AMP
CT PROBE, AMP APTIMA: NEGATIVE
GC PROBE AMP APTIMA: NEGATIVE

## 2014-04-01 ENCOUNTER — Other Ambulatory Visit: Payer: Self-pay | Admitting: Obstetrics

## 2014-04-01 DIAGNOSIS — N760 Acute vaginitis: Principal | ICD-10-CM

## 2014-04-01 DIAGNOSIS — B9689 Other specified bacterial agents as the cause of diseases classified elsewhere: Secondary | ICD-10-CM

## 2014-04-01 MED ORDER — METRONIDAZOLE 500 MG PO TABS: 500.0000 mg | ORAL_TABLET | Freq: Two times a day (BID) | ORAL | Status: DC

## 2014-05-17 ENCOUNTER — Encounter: Payer: Self-pay | Admitting: Obstetrics

## 2015-05-12 ENCOUNTER — Ambulatory Visit: Payer: Self-pay | Admitting: Obstetrics

## 2015-05-30 ENCOUNTER — Emergency Department (HOSPITAL_COMMUNITY)
Admission: EM | Admit: 2015-05-30 | Discharge: 2015-05-30 | Disposition: A | Discharge status: Home / Self Care | Payer: Medicaid Other

## 2015-05-30 NOTE — ED Notes (Signed)
Pt called with no answer

## 2015-09-30 ENCOUNTER — Emergency Department (HOSPITAL_COMMUNITY)
Admission: EM | Admit: 2015-09-30 | Discharge: 2015-09-30 | Disposition: A | Discharge status: Home / Self Care | Payer: Medicaid Other | Attending: Emergency Medicine | Admitting: Emergency Medicine

## 2015-09-30 ENCOUNTER — Emergency Department (HOSPITAL_COMMUNITY): Payer: Medicaid Other

## 2015-09-30 ENCOUNTER — Encounter (HOSPITAL_COMMUNITY): Payer: Self-pay | Admitting: *Deleted

## 2015-09-30 DIAGNOSIS — S0181XA Laceration without foreign body of other part of head, initial encounter: Secondary | ICD-10-CM | POA: Insufficient documentation

## 2015-09-30 DIAGNOSIS — Y92410 Unspecified street and highway as the place of occurrence of the external cause: Secondary | ICD-10-CM | POA: Insufficient documentation

## 2015-09-30 DIAGNOSIS — S8992XA Unspecified injury of left lower leg, initial encounter: Secondary | ICD-10-CM | POA: Insufficient documentation

## 2015-09-30 DIAGNOSIS — Y998 Other external cause status: Secondary | ICD-10-CM | POA: Insufficient documentation

## 2015-09-30 DIAGNOSIS — Z041 Encounter for examination and observation following transport accident: Secondary | ICD-10-CM

## 2015-09-30 DIAGNOSIS — S0191XA Laceration without foreign body of unspecified part of head, initial encounter: Secondary | ICD-10-CM

## 2015-09-30 DIAGNOSIS — F17200 Nicotine dependence, unspecified, uncomplicated: Secondary | ICD-10-CM | POA: Insufficient documentation

## 2015-09-30 DIAGNOSIS — Y9389 Activity, other specified: Secondary | ICD-10-CM | POA: Insufficient documentation

## 2015-09-30 DIAGNOSIS — Z79899 Other long term (current) drug therapy: Secondary | ICD-10-CM | POA: Insufficient documentation

## 2015-09-30 DIAGNOSIS — Z043 Encounter for examination and observation following other accident: Secondary | ICD-10-CM

## 2015-09-30 DIAGNOSIS — Z792 Long term (current) use of antibiotics: Secondary | ICD-10-CM | POA: Insufficient documentation

## 2015-09-30 MED ORDER — IBUPROFEN 800 MG PO TABS: 800.0000 mg | ORAL_TABLET | Freq: Three times a day (TID) | ORAL | Status: DC

## 2015-09-30 MED ORDER — OXYCODONE-ACETAMINOPHEN 5-325 MG PO TABS
1.0000 | ORAL_TABLET | Freq: Once | ORAL | Status: DC
  Filled 2015-09-30: qty 1

## 2015-09-30 MED ORDER — KETOROLAC TROMETHAMINE 60 MG/2ML IM SOLN
60.0000 mg | Freq: Once | INTRAMUSCULAR | Status: AC
  Administered 2015-09-30: 60 mg via INTRAMUSCULAR
  Filled 2015-09-30: qty 2

## 2015-09-30 MED ORDER — METHOCARBAMOL 500 MG PO TABS: 500.0000 mg | ORAL_TABLET | Freq: Two times a day (BID) | ORAL | Status: DC
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
## 2015-09-30 NOTE — ED Notes (Signed)
[employer] at bedside 
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
## 2017-04-29 ENCOUNTER — Encounter (HOSPITAL_COMMUNITY): Payer: Self-pay

## 2017-05-10 ENCOUNTER — Encounter: Payer: Self-pay | Admitting: Obstetrics

## 2017-05-10 ENCOUNTER — Ambulatory Visit (INDEPENDENT_AMBULATORY_CARE_PROVIDER_SITE_OTHER): Payer: 59 | Admitting: Obstetrics

## 2017-05-10 VITALS — BP 111/68 | HR 78 | Ht 67.0 in | Wt 277.4 lb

## 2017-05-10 DIAGNOSIS — Z01419 Encounter for gynecological examination (general) (routine) without abnormal findings: Secondary | ICD-10-CM

## 2017-05-10 DIAGNOSIS — Z3046 Encounter for surveillance of implantable subdermal contraceptive: Secondary | ICD-10-CM

## 2017-05-10 DIAGNOSIS — Z6841 Body Mass Index (BMI) 40.0 and over, adult: Secondary | ICD-10-CM

## 2017-05-10 DIAGNOSIS — Z0000 Encounter for general adult medical examination without abnormal findings: Secondary | ICD-10-CM

## 2017-05-10 DIAGNOSIS — L738 Other specified follicular disorders: Secondary | ICD-10-CM

## 2017-05-10 DIAGNOSIS — Z113 Encounter for screening for infections with a predominantly sexual mode of transmission: Secondary | ICD-10-CM | POA: Diagnosis not present

## 2017-05-10 DIAGNOSIS — Z1389 Encounter for screening for other disorder: Secondary | ICD-10-CM

## 2017-05-10 MED ORDER — CLINDAMYCIN PHOSPHATE 1 % EX LOTN: TOPICAL_LOTION | Freq: Two times a day (BID) | CUTANEOUS | 4 refills | Status: DC

## 2017-05-10 NOTE — Progress Notes (Signed)
Subjective:        Jacqueline Haas is a 31 y.o. female here for a routine exam.  Current complaints: Chronic hair bumps in pubic area.  Concerned about weight gain.  Considering different contraception after removal of Nexplanon.  Personal health questionnaire:  Is patient Ashkenazi Jewish, have a family history of breast and/or ovarian cancer: no Is there a family history of uterine cancer diagnosed at age < 62, gastrointestinal cancer, urinary tract cancer, family member who is a Personnel officer syndrome-associated carrier: no Is the patient overweight and hypertensive, family history of diabetes, personal history of gestational diabetes, preeclampsia or PCOS: no Is patient over 74, have PCOS,  family history of premature CHD under age 53, diabetes, smoke, have hypertension or peripheral artery disease:  no At any time, has a partner hit, kicked or otherwise hurt or frightened you?: no Over the past 2 weeks, have you felt down, depressed or hopeless?: no Over the past 2 weeks, have you felt little interest or pleasure in doing things?:no   Gynecologic History No LMP recorded. Patient has had an implant. Contraception: Nexplanon Last Pap: 2015. Results were: normal Last mammogram: n/a. Results were: n/a  Obstetric History OB History  Gravida Para Term Preterm AB Living  3 2 2  0 1 2  SAB TAB Ectopic Multiple Live Births  0 1 0   2    # Outcome Date GA Lbr Len/2nd Weight Sex Delivery Anes PTL Lv  3 Term         LIV  2 Term         LIV  1 TAB         DEC      History reviewed. No pertinent past medical history.  Past Surgical History:  Procedure Laterality Date  . CESAREAN SECTION    . TONSILLECTOMY       Current Outpatient Prescriptions:  .  Ascorbic Acid (VITAMIN C) 1000 MG tablet, Take 1,000 mg by mouth daily., Disp: , Rfl:  .  Biotin 10 MG TABS, Take by mouth., Disp: , Rfl:  .  etonogestrel (IMPLANON) 68 MG IMPL implant, Inject 68 mg into the skin once. Change every 3  years , Disp: , Rfl:  .  Multiple Vitamins-Minerals (MULTIVITAMIN WITH MINERALS) tablet, Take 1 tablet by mouth daily., Disp: , Rfl:  Allergies  Allergen Reactions  . Morphine And Related Hives  . Morphine And Related     hives    Social History  Substance Use Topics  . Smoking status: Former Smoker    Types: E-cigarettes  . Smokeless tobacco: Never Used     Comment: Hookah  . Alcohol use Yes     Comment: Occasionally     History reviewed. No pertinent family history.    Review of Systems  Constitutional: negative for fatigue and weight loss Respiratory: negative for cough and wheezing Cardiovascular: negative for chest pain, fatigue and palpitations Gastrointestinal: negative for abdominal pain and change in bowel habits Musculoskeletal:negative for myalgias Neurological: negative for gait problems and tremors Behavioral/Psych: negative for abusive relationship, depression Endocrine: negative for temperature intolerance    Genitourinary:negative for abnormal menstrual periods, genital lesions, hot flashes, sexual problems and vaginal discharge Integument/breast: negative for breast lump, breast tenderness, nipple discharge and skin lesion(s)    Objective:       BP 111/68   Pulse 78   Ht 5\' 7"  (1.702 m)   Wt 277 lb 6.4 oz (125.8 kg)   BMI 43.45 kg/m  General:   alert  Skin:   no rash or abnormalities  Lungs:   clear to auscultation bilaterally  Heart:   regular rate and rhythm, S1, S2 normal, no murmur, click, rub or gallop  Breasts:   normal without suspicious masses, skin or nipple changes or axillary nodes  Abdomen:  normal findings: no organomegaly, soft, non-tender and no hernia  Pelvis:  External genitalia: normal general appearance Urinary system: urethral meatus normal and bladder without fullness, nontender Vaginal: normal without tenderness, induration or masses Cervix: normal appearance Adnexa: normal bimanual exam Uterus: anteverted and non-tender,  normal size   Lab Review Urine pregnancy test Labs reviewed yes Radiologic studies reviewed no  50% of 20 min visit spent on counseling and coordination of care.    Assessment:     1. Encounter for routine gynecological examination with Papanicolaou smear of cervix Rx: - Cytology - PAP - Cervicovaginal ancillary only  2. Encounter for surveillance of implantable subdermal contraceptive Rx: - Hepatitis B surface antigen - Hepatitis C antibody - HIV antibody - RPR  3. Screening for STD (sexually transmitted disease)   4. Folliculitis barbae Rx: - clindamycin (CLEOCIN-T) 1 % lotion; Apply topically 2 (two) times daily.  Dispense: 60 mL; Refill: 4  5. Routine adult health maintenance Rx: - Ambulatory referral to Internal Medicine  6. Class 3 severe obesity due to excess calories without serious comorbidity with body mass index (BMI) of 40.0 to 44.9 in adult [facility](HCC) - program of caloric reduction, exercise and behavioral modification recommended   Plan:    Education reviewed: calcium supplements, depression evaluation, low fat, low cholesterol diet, safe sex/STD prevention, self breast exams and weight bearing exercise. Contraception: considering options. Follow up in: 3 years.   Meds ordered this encounter  Medications  . Ascorbic Acid (VITAMIN C) 1000 MG tablet    Sig: Take 1,000 mg by mouth daily.  . Multiple Vitamins-Minerals (MULTIVITAMIN WITH MINERALS) tablet    Sig: Take 1 tablet by mouth daily.  . Biotin 10 MG TABS    Sig: Take by mouth.   No orders of the defined types were placed in this encounter.

## 2017-05-11 LAB — HEPATITIS C ANTIBODY: Hep C Virus Ab: 0.1 {s_co_ratio} (ref 0.0–0.9)

## 2017-05-11 LAB — HEPATITIS B SURFACE ANTIGEN: Hepatitis B Surface Ag: NEGATIVE

## 2017-05-11 LAB — HIV ANTIBODY (ROUTINE TESTING W REFLEX): HIV Screen 4th Generation wRfx: NONREACTIVE

## 2017-05-11 LAB — SYPHILIS: RPR W/REFLEX TO RPR TITER AND TREPONEMAL ANTIBODIES, TRADITIONAL SCREENING AND DIAGNOSIS ALGORITHM: RPR Ser Ql: NONREACTIVE

## 2017-05-13 ENCOUNTER — Encounter: Payer: Self-pay | Admitting: Obstetrics

## 2017-05-13 ENCOUNTER — Ambulatory Visit (INDEPENDENT_AMBULATORY_CARE_PROVIDER_SITE_OTHER): Payer: 59 | Admitting: Obstetrics

## 2017-05-13 ENCOUNTER — Encounter: Payer: Self-pay | Admitting: *Deleted

## 2017-05-13 VITALS — BP 133/80 | HR 81 | Ht 67.0 in | Wt 274.8 lb

## 2017-05-13 DIAGNOSIS — Z0000 Encounter for general adult medical examination without abnormal findings: Secondary | ICD-10-CM

## 2017-05-13 DIAGNOSIS — Z3046 Encounter for surveillance of implantable subdermal contraceptive: Secondary | ICD-10-CM | POA: Diagnosis not present

## 2017-05-13 DIAGNOSIS — Z30011 Encounter for initial prescription of contraceptive pills: Secondary | ICD-10-CM

## 2017-05-13 MED ORDER — ETONOGESTREL 68 MG ~~LOC~~ IMPL: 68.0000 mg | DRUG_IMPLANT | Freq: Once | SUBCUTANEOUS | Status: DC

## 2017-05-13 MED ORDER — LO LOESTRIN FE 1 MG-10 MCG / 10 MCG PO TABS: 1.0000 | ORAL_TABLET | Freq: Every day | ORAL | 4 refills | Status: DC

## 2017-05-13 NOTE — Progress Notes (Signed)
Pt here for nexplanon removal.  

## 2017-05-13 NOTE — Progress Notes (Signed)

## 2017-05-14 LAB — CYTOLOGY - PAP: Diagnosis: NEGATIVE

## 2017-05-14 LAB — CERVICOVAGINAL ANCILLARY ONLY
BACTERIAL VAGINITIS: NEGATIVE
CHLAMYDIA, DNA PROBE: NEGATIVE
Candida vaginitis: POSITIVE — AB
NEISSERIA GONORRHEA: NEGATIVE
Trichomonas: NEGATIVE

## 2017-05-15 ENCOUNTER — Other Ambulatory Visit: Payer: Self-pay | Admitting: Obstetrics

## 2017-05-15 DIAGNOSIS — B373 Candidiasis of vulva and vagina: Secondary | ICD-10-CM

## 2017-05-15 DIAGNOSIS — B3731 Acute candidiasis of vulva and vagina: Secondary | ICD-10-CM

## 2017-05-15 MED ORDER — FLUCONAZOLE 150 MG PO TABS: 150.0000 mg | ORAL_TABLET | Freq: Once | ORAL | 0 refills | Status: DC

## 2017-05-27 ENCOUNTER — Ambulatory Visit: Payer: 59 | Admitting: Obstetrics

## 2017-08-19 ENCOUNTER — Telehealth: Payer: Self-pay

## 2017-08-19 NOTE — Telephone Encounter (Signed)
TC to patient regarding message. Pt states she never received BV medication.  Left detailed message for pt to call back.

## 2017-08-20 ENCOUNTER — Other Ambulatory Visit: Payer: Self-pay

## 2017-08-20 ENCOUNTER — Telehealth: Payer: Self-pay

## 2017-08-20 DIAGNOSIS — B9689 Other specified bacterial agents as the cause of diseases classified elsewhere: Secondary | ICD-10-CM

## 2017-08-20 DIAGNOSIS — B3731 Acute candidiasis of vulva and vagina: Secondary | ICD-10-CM

## 2017-08-20 DIAGNOSIS — N760 Acute vaginitis: Principal | ICD-10-CM

## 2017-08-20 DIAGNOSIS — B373 Candidiasis of vulva and vagina: Secondary | ICD-10-CM

## 2017-08-20 MED ORDER — METRONIDAZOLE 500 MG PO TABS: 500.0000 mg | ORAL_TABLET | Freq: Two times a day (BID) | ORAL | 0 refills | Status: AC

## 2017-08-20 MED ORDER — FLUCONAZOLE 150 MG PO TABS: 150.0000 mg | ORAL_TABLET | Freq: Once | ORAL | 0 refills | Status: AC

## 2017-08-20 NOTE — Telephone Encounter (Signed)
I was able to contact pt regarding concerns. Consulted w/ Dr.harper  Rx resent

## 2017-10-23 ENCOUNTER — Encounter: Payer: Self-pay | Admitting: Obstetrics

## 2017-10-23 ENCOUNTER — Ambulatory Visit (INDEPENDENT_AMBULATORY_CARE_PROVIDER_SITE_OTHER): Payer: 59 | Admitting: Obstetrics

## 2017-10-23 VITALS — BP 124/81 | HR 78 | Temp 98.8°F | Resp 16 | Wt 262.4 lb

## 2017-10-23 DIAGNOSIS — R21 Rash and other nonspecific skin eruption: Secondary | ICD-10-CM | POA: Diagnosis not present

## 2017-10-23 DIAGNOSIS — N644 Mastodynia: Secondary | ICD-10-CM

## 2017-10-23 DIAGNOSIS — N6452 Nipple discharge: Secondary | ICD-10-CM

## 2017-10-23 MED ORDER — PREDNISONE 10 MG (21) PO TBPK: ORAL_TABLET | ORAL | 0 refills | Status: DC

## 2017-10-23 NOTE — Progress Notes (Signed)
Patient ID: Jacqueline Haas, female   DOB: [birth year], 32 y.o.   MRN: [id number]  Chief Complaint  Patient presents with  . Rash    L Breast x 1 wk; upper extremities x 2-3 days    HPI Jacqueline Haas is a 32 y.o. female.  Rash started on left breast 1 week ago and is now spreading over body.  Rash is mildly pruritic.  Has also had nipple discharge. HPI  No past medical history on file.  Past Surgical History:  Procedure Laterality Date  . CESAREAN SECTION    . TONSILLECTOMY      No family history on file.  Social History Social History   Tobacco Use  . Smoking status: Former Smoker    Types: E-cigarettes  . Smokeless tobacco: Never Used  . Tobacco comment: Hookah  Substance Use Topics  . Alcohol use: Yes    Comment: Occasionally   . Drug use: No    Allergies  Allergen Reactions  . Morphine And Related Hives  . Morphine And Related     hives    Current Outpatient Medications  Medication Sig Dispense Refill  . Ascorbic Acid (VITAMIN C) 1000 MG tablet Take 1,000 mg by mouth daily.    . Biotin 10 MG TABS Take by mouth.    . clindamycin (CLEOCIN-T) 1 % lotion Apply topically 2 (two) times daily. 60 mL 4  . Multiple Vitamins-Minerals (MULTIVITAMIN WITH MINERALS) tablet Take 1 tablet by mouth daily.    . predniSONE (STERAPRED UNI-PAK 21 TAB) 10 MG (21) TBPK tablet Take as directed. 21 tablet 0   Current Facility-Administered Medications  Medication Dose Route Frequency Provider Last Rate Last Dose  . etonogestrel (NEXPLANON) implant 68 mg  68 mg Subdermal Once Brock BadHarper, Charles A, MD        Review of Systems Review of Systems Constitutional: negative for fatigue and weight loss Respiratory: negative for cough and wheezing Cardiovascular: negative for chest pain, fatigue and palpitations Gastrointestinal: negative for abdominal pain and change in bowel habits Genitourinary:negative Integument/breast: positive for rash and nipple discharge - left  breast Musculoskeletal:negative for myalgias Neurological: negative for gait problems and tremors Behavioral/Psych: negative for abusive relationship, depression Endocrine: negative for temperature intolerance      Blood pressure 124/81, pulse 78, temperature 98.8 F (37.1 C), temperature source Oral, resp. rate 16, weight 262 lb 6.4 oz (119 kg), last menstrual period 10/09/2017.  Physical Exam Physical Exam General:   alert  Skin:   no rash or abnormalities  Lungs:   clear to auscultation bilaterally  Heart:   regular rate and rhythm, S1, S2 normal, no murmur, click, rub or gallop  Breasts:   Left breast with crusting rash, tender.  No palpable masses.  50% of 15 min visit spent on counseling and coordination of care.   Data Reviewed Labs  Assessment     1. Rash and nonspecific skin eruption Rx: - predniSONE (STERAPRED UNI-PAK 21 TAB) 10 MG (21) TBPK tablet; Take as directed.  Dispense: 21 tablet; Refill: 0 - US BREAST LTD UNI LEFT INC AXILLA; Future  2. Mastodynia of left breast Rx: - US BREAST LTD UNI LEFT INC AXILLA; Future  3. Discharge from left nipple Rx: - US BREAST LTD UNI LEFT INC AXILLA; Future    Plan    Follow up in 2 weeks   Orders Placed This Encounter  Procedures  . US BREAST LTD UNI LEFT INC AXILLA    Standing Status:   Future  Standing Expiration Date:   12/24/2018    Order Specific Question:   Reason for Exam (SYMPTOM  OR DIAGNOSIS REQUIRED)    Answer:   Rash and tenderness left breast.  Nipple discharge left breast.    Order Specific Question:   Preferred imaging location?    Answer:   [facility]   Meds ordered this encounter  Medications  . predniSONE (STERAPRED UNI-PAK 21 TAB) 10 MG (21) TBPK tablet    Sig: Take as directed.    Dispense:  21 tablet    Refill:  0     Brock Bad MD 4-10- 2019

## 2017-11-05 ENCOUNTER — Ambulatory Visit: Payer: 59 | Admitting: Obstetrics

## 2017-11-06 ENCOUNTER — Ambulatory Visit (INDEPENDENT_AMBULATORY_CARE_PROVIDER_SITE_OTHER): Payer: 59 | Admitting: Obstetrics

## 2017-11-06 ENCOUNTER — Encounter: Payer: Self-pay | Admitting: Obstetrics

## 2017-11-06 DIAGNOSIS — B369 Superficial mycosis, unspecified: Secondary | ICD-10-CM

## 2017-11-06 MED ORDER — AMOXICILLIN-POT CLAVULANATE 875-125 MG PO TABS: 1.0000 | ORAL_TABLET | Freq: Two times a day (BID) | ORAL | 5 refills | Status: DC

## 2017-11-06 MED ORDER — CLOTRIMAZOLE 1 % EX CREA: 1.0000 "application " | TOPICAL_CREAM | Freq: Two times a day (BID) | CUTANEOUS | 1 refills | Status: DC

## 2017-11-06 NOTE — Progress Notes (Signed)
RGYN patient presents for F/U visit today.   Pt was seen 10/23/17 for rash/skin irritation  Pt states she now notices what may be yeast on her skin under her breast and between her legs.   Would like different medication treatment.

## 2017-11-06 NOTE — Progress Notes (Signed)
Patient ID: Jacqueline Haas, female   DOB: 1985/12/23, 32 y.o.   MRN: [id number]  Chief Complaint  Patient presents with  . Follow-up    Rash , skin irritation     HPI Jacqueline Haas is a 32 y.o. female.  Has a pruritic rash under breasts and on inner-upper thighs. HPI  History reviewed. No pertinent past medical history.  Past Surgical History:  Procedure Laterality Date  . CESAREAN SECTION    . TONSILLECTOMY      History reviewed. No pertinent family history.  Social History Social History   Tobacco Use  . Smoking status: Former Smoker    Types: E-cigarettes  . Smokeless tobacco: Never Used  . Tobacco comment: Hookah  Substance Use Topics  . Alcohol use: Yes    Comment: Occasionally   . Drug use: No    Allergies  Allergen Reactions  . Morphine And Related Hives  . Morphine And Related     hives    Current Outpatient Medications  Medication Sig Dispense Refill  . amoxicillin-clavulanate (AUGMENTIN) 875-125 MG tablet Take 1 tablet by mouth 2 (two) times daily. 14 tablet 5  . Ascorbic Acid (VITAMIN C) 1000 MG tablet Take 1,000 mg by mouth daily.    . Biotin 10 MG TABS Take by mouth.    . clindamycin (CLEOCIN-T) 1 % lotion Apply topically 2 (two) times daily. 60 mL 4  . clotrimazole (LOTRIMIN) 1 % cream Apply 1 application topically 2 (two) times daily. 113 g 1  . Multiple Vitamins-Minerals (MULTIVITAMIN WITH MINERALS) tablet Take 1 tablet by mouth daily.    . predniSONE (STERAPRED UNI-PAK 21 TAB) 10 MG (21) TBPK tablet Take as directed. 21 tablet 0   Current Facility-Administered Medications  Medication Dose Route Frequency Provider Last Rate Last Dose  . etonogestrel (NEXPLANON) implant 68 mg  68 mg Subdermal Once Brock Bad, MD        Review of Systems Review of Systems Constitutional: negative for fatigue and weight loss Respiratory: negative for cough and wheezing Cardiovascular: negative for chest pain, fatigue and  palpitations Gastrointestinal: negative for abdominal pain and change in bowel habits Genitourinary:negative Integument/breast: POSITIVE for pruritic rash under breasts and inner thighs Musculoskeletal:negative for myalgias Neurological: negative for gait problems and tremors Behavioral/Psych: negative for abusive relationship, depression Endocrine: negative for temperature intolerance      Last menstrual period 10/09/2017.  Physical Exam Physical Exam General:   alert  Skin:   rash under breasts and inner thighs  Lungs:   clear to auscultation bilaterally  Heart:   regular rate and rhythm, S1, S2 normal, no murmur, click, rub or gallop  Breast  rash on inner breast folds   50% of 15 min visit spent on counseling and coordination of care.   Data Reviewed Labs  Assessment     1. Superficial fungus infection of skin Rx: - clotrimazole (LOTRIMIN) 1 % cream; Apply 1 application topically 2 (two) times daily.  Dispense: 113 g; Refill: 1    Plan    Follow up prn  No orders of the defined types were placed in this encounter.  Meds ordered this encounter  Medications  . clotrimazole (LOTRIMIN) 1 % cream    Sig: Apply 1 application topically 2 (two) times daily.    Dispense:  113 g    Refill:  1  . amoxicillin-clavulanate (AUGMENTIN) 875-125 MG tablet    Sig: Take 1 tablet by mouth 2 (two) times daily.    Dispense:  14 tablet    Refill:  5    Brock BadHARLES A. Kaleigha Chamberlin MD 11-06-2017

## 2017-11-11 ENCOUNTER — Ambulatory Visit: Payer: 59 | Admitting: Obstetrics

## 2018-03-31 ENCOUNTER — Ambulatory Visit: Payer: 59 | Admitting: Obstetrics

## 2018-05-12 ENCOUNTER — Ambulatory Visit: Payer: 59 | Admitting: Obstetrics

## 2018-05-20 ENCOUNTER — Ambulatory Visit: Payer: 59 | Admitting: Obstetrics

## 2018-06-03 ENCOUNTER — Ambulatory Visit: Payer: 59 | Admitting: Obstetrics

## 2018-07-28 ENCOUNTER — Ambulatory Visit: Payer: 59 | Admitting: Obstetrics

## 2018-08-18 ENCOUNTER — Ambulatory Visit: Payer: 59 | Admitting: Obstetrics

## 2018-09-02 ENCOUNTER — Ambulatory Visit: Payer: 59 | Admitting: Obstetrics

## 2018-09-15 ENCOUNTER — Ambulatory Visit: Payer: 59 | Admitting: Obstetrics

## 2019-01-07 ENCOUNTER — Ambulatory Visit (INDEPENDENT_AMBULATORY_CARE_PROVIDER_SITE_OTHER): Payer: Managed Care, Other (non HMO)

## 2019-01-07 ENCOUNTER — Other Ambulatory Visit: Payer: Self-pay

## 2019-01-07 VITALS — BP 128/87 | HR 73 | Ht 67.0 in | Wt 252.0 lb

## 2019-01-07 DIAGNOSIS — Z3202 Encounter for pregnancy test, result negative: Secondary | ICD-10-CM | POA: Diagnosis not present

## 2019-01-07 LAB — POCT URINE PREGNANCY: Preg Test, Ur: NEGATIVE

## 2019-01-07 NOTE — Progress Notes (Signed)
Jacqueline Haas presents today for UPT. She has no unusual complaints.  LMP: Period is unknown, she had a TAB 12/02/2018.    OBJECTIVE: Appears well, in no apparent distress.  OB History    Gravida  3   Para  2   Term  2   Preterm  0   AB  1   Living  2     SAB  0   TAB  1   Ectopic  0   Multiple      Live Births  2          Home UPT Result: POSITIVE x4 In-Office UPT result:NEGATIVE  I have reviewed the patient's medical, obstetrical, social, and family histories, and medications.   ASSESSMENT: NEGATIVE pregnancy test  PLAN: HCG Serum Qualitative, patient insisted that she wants a blood Pregnancy test because she took 4 UPT's at home and they were all POSITIVE; the most recent UPT was on Sunday, 01/04/2019.

## 2019-01-08 ENCOUNTER — Other Ambulatory Visit: Payer: Self-pay

## 2019-01-08 ENCOUNTER — Telehealth: Payer: Self-pay

## 2019-01-08 LAB — HCG, SERUM, QUALITATIVE: hCG,Beta Subunit,Qual,Serum: POSITIVE m[IU]/mL — AB (ref <6)

## 2019-01-08 MED ORDER — VITAFOL GUMMIES 3.33-0.333-34.8 MG PO CHEW: 1.0000 | CHEWABLE_TABLET | Freq: Every day | ORAL | 12 refills | Status: DC

## 2019-01-08 NOTE — Telephone Encounter (Signed)
Pt called requesting recent quant results. Pt informed quant is positive for pregnancy and she needs to start pregnancy care. Pt will make an appt.

## 2019-01-08 NOTE — Progress Notes (Signed)
Quant positive  She requests PNV gummies.  Rx sent to the pharmacy per protocol.

## 2019-01-30 ENCOUNTER — Encounter (HOSPITAL_COMMUNITY): Payer: Self-pay | Admitting: *Deleted

## 2019-01-30 ENCOUNTER — Inpatient Hospital Stay (HOSPITAL_COMMUNITY)
Admission: AD | Admit: 2019-01-30 | Discharge: 2019-01-31 | Disposition: A | Discharge status: Home / Self Care | Payer: Managed Care, Other (non HMO) | Attending: Obstetrics and Gynecology | Admitting: Obstetrics and Gynecology

## 2019-01-30 ENCOUNTER — Telehealth: Payer: Self-pay | Admitting: *Deleted

## 2019-01-30 ENCOUNTER — Other Ambulatory Visit: Payer: Self-pay

## 2019-01-30 DIAGNOSIS — Z885 Allergy status to narcotic agent status: Secondary | ICD-10-CM | POA: Insufficient documentation

## 2019-01-30 DIAGNOSIS — Z3202 Encounter for pregnancy test, result negative: Secondary | ICD-10-CM | POA: Insufficient documentation

## 2019-01-30 DIAGNOSIS — Z87891 Personal history of nicotine dependence: Secondary | ICD-10-CM | POA: Diagnosis not present

## 2019-01-30 DIAGNOSIS — O4690 Antepartum hemorrhage, unspecified, unspecified trimester: Secondary | ICD-10-CM

## 2019-01-30 DIAGNOSIS — N939 Abnormal uterine and vaginal bleeding, unspecified: Secondary | ICD-10-CM

## 2019-01-30 HISTORY — DX: Urinary tract infection, site not specified: N39.0

## 2019-01-30 LAB — URINALYSIS, ROUTINE W REFLEX MICROSCOPIC
Bilirubin Urine: NEGATIVE
Glucose, UA: NEGATIVE mg/dL
Ketones, ur: NEGATIVE mg/dL
Nitrite: NEGATIVE
Protein, ur: 100 mg/dL — AB
Specific Gravity, Urine: 1.03 (ref 1.005–1.030)
pH: 5 (ref 5.0–8.0)

## 2019-01-30 LAB — WET PREP, GENITAL
Sperm: NONE SEEN
Trich, Wet Prep: NONE SEEN
Yeast Wet Prep HPF POC: NONE SEEN

## 2019-01-30 NOTE — Telephone Encounter (Signed)
Pt called to office LM stating she is having some spotting and would like to know what to do.   Attempt to contact pt. LM on VM to call if needed.

## 2019-01-30 NOTE — MAU Provider Note (Signed)
History     CSN: [id number]  Arrival date and time: 01/30/19 2058   First Provider Initiated Contact with Patient 01/30/19 2311      Chief Complaint  Patient presents with  . Vaginal Bleeding   Ms. Jacqueline Haas is a 33 y.o. [phone] at Unknown who presents to MAU for vaginal bleeding which began yesterday AM.  Passing blood clots? no Blood soaking clothes? no Lightheaded/dizzy? no Significant pelvic pain or cramping? no, just dull ache in back Passed any tissue? no Hx ectopic pregnancy? no Hx of PID, GYN surgery? C/S only  Current pregnancy problems? Pt has not yet been seen, pt denies hx of pregnancy problems in previous pregnancies Blood Type? O Positive Allergies? morphine Current medications? PNVs only  Pt denies vaginal discharge/odor/itching. Pt denies N/V, abdominal pain, constipation, diarrhea, or urinary problems. Pt denies fever, chills, fatigue, sweating or changes in appetite. Pt denies SOB or chest pain. Pt denies dizziness, HA, light-headedness, weakness.   OB History    Gravida  4   Para  2   Term  2   Preterm  0   AB  1   Living  2     SAB  0   TAB  1   Ectopic  0   Multiple      Live Births  2           Past Medical History:  Diagnosis Date  . UTI (urinary tract infection)     Past Surgical History:  Procedure Laterality Date  . CESAREAN SECTION    . TONSILLECTOMY      History reviewed. No pertinent family history.  Social History   Tobacco Use  . Smoking status: Former Smoker    Types: E-cigarettes    Quit date: 01/30/2015    Years since quitting: 4.0  . Smokeless tobacco: Never Used  . Tobacco comment: Hookah  Substance Use Topics  . Alcohol use: Yes    Comment: last adult beverage 22 june 20  . Drug use: No    Allergies:  Allergies  Allergen Reactions  . Morphine And Related Hives  . Morphine And Related     hives    Facility-Administered Medications Prior to Admission  Medication Dose Route  Frequency Provider Last Rate Last Dose  . etonogestrel (NEXPLANON) implant 68 mg  68 mg Subdermal Once Brock BadHarper, Charles A, MD       Medications Prior to Admission  Medication Sig Dispense Refill Last Dose  . Prenatal Vit-Fe Phos-FA-Omega (VITAFOL GUMMIES) 3.33-0.333-34.8 MG CHEW Chew 1 tablet by mouth daily. 90 tablet 12 01/30/2019 at Unknown time  . amoxicillin-clavulanate (AUGMENTIN) 875-125 MG tablet Take 1 tablet by mouth 2 (two) times daily. (Patient not taking: Reported on 01/07/2019) 14 tablet 5 More than a month at Unknown time  . Ascorbic Acid (VITAMIN C) 1000 MG tablet Take 1,000 mg by mouth daily.   More than a month at Unknown time  . Biotin 10 MG TABS Take by mouth.   More than a month at Unknown time  . clindamycin (CLEOCIN-T) 1 % lotion Apply topically 2 (two) times daily. (Patient not taking: Reported on 01/07/2019) 60 mL 4 More than a month at Unknown time  . clotrimazole (LOTRIMIN) 1 % cream Apply 1 application topically 2 (two) times daily. (Patient not taking: Reported on 01/07/2019) 113 g 1 More than a month at Unknown time  . Multiple Vitamins-Minerals (MULTIVITAMIN WITH MINERALS) tablet Take 1 tablet by mouth daily.   More  than a month at Unknown time  . predniSONE (STERAPRED UNI-PAK 21 TAB) 10 MG (21) TBPK tablet Take as directed. (Patient not taking: Reported on 01/07/2019) 21 tablet 0 More than a month at Unknown time    Review of Systems  Constitutional: Negative for chills, diaphoresis, fatigue and fever.  Respiratory: Negative for shortness of breath.   Cardiovascular: Negative for chest pain.  Gastrointestinal: Negative for abdominal pain, constipation, diarrhea, nausea and vomiting.  Genitourinary: Positive for vaginal bleeding. Negative for dysuria, flank pain, frequency, pelvic pain, urgency and vaginal discharge.  Musculoskeletal: Positive for back pain.  Neurological: Negative for dizziness, weakness, light-headedness and headaches.   Physical Exam   Blood  pressure 130/78, pulse 71, temperature 98.5 F (36.9 C), temperature source Oral, resp. rate 16, height 5\' 7"  (1.702 m), weight 114.7 kg, last menstrual period 12/02/2018.  Patient Vitals for the past 24 hrs:  BP Temp Temp src Pulse Resp Height Weight  01/31/19 0118 130/78 - - 71 16 - -  01/30/19 2126 128/80 98.5 F (36.9 C) Oral 80 18 5\' 7"  (1.702 m) 114.7 kg   Physical Exam  Constitutional: She is oriented to person, place, and time. She appears well-developed and well-nourished. No distress.  HENT:  Head: Normocephalic and atraumatic.  Respiratory: Effort normal.  GI: Soft. She exhibits no distension and no mass. There is no abdominal tenderness. There is no rebound and no guarding.  Genitourinary: There is no rash, tenderness or lesion on the right labia. There is no rash, tenderness or lesion on the left labia. Uterus is not enlarged and not tender. Cervix exhibits no motion tenderness, no discharge and no friability. Right adnexum displays no mass, no tenderness and no fullness. Left adnexum displays no mass, no tenderness and no fullness.    Vaginal bleeding present.     No vaginal discharge or tenderness.  There is bleeding in the vagina. No tenderness in the vagina.  Neurological: She is alert and oriented to person, place, and time.  Skin: Skin is warm and dry. She is not diaphoretic.  Psychiatric: She has a normal mood and affect. Her behavior is normal. Judgment and thought content normal.   Results for orders placed or performed during the hospital encounter of 01/30/19 (from the past 24 hour(s))  Urinalysis, Routine w reflex microscopic     Status: Abnormal   Collection Time: 01/30/19  9:35 PM  Result Value Ref Range   Color, Urine AMBER (A) YELLOW   APPearance CLOUDY (A) CLEAR   Specific Gravity, Urine 1.030 1.005 - 1.030   pH 5.0 5.0 - 8.0   Glucose, UA NEGATIVE NEGATIVE mg/dL   Hgb urine dipstick LARGE (A) NEGATIVE   Bilirubin Urine NEGATIVE NEGATIVE   Ketones, ur  NEGATIVE NEGATIVE mg/dL   Protein, ur 161100 (A) NEGATIVE mg/dL   Nitrite NEGATIVE NEGATIVE   Leukocytes,Ua MODERATE (A) NEGATIVE   RBC / HPF 6-10 0 - 5 RBC/hpf   WBC, UA 21-50 0 - 5 WBC/hpf   Bacteria, UA RARE (A) NONE SEEN   Squamous Epithelial / LPF 11-20 0 - 5   Mucus PRESENT    Non Squamous Epithelial 0-5 (A) NONE SEEN  Wet prep, genital     Status: Abnormal   Collection Time: 01/30/19 11:30 PM   Specimen: Vaginal  Result Value Ref Range   Yeast Wet Prep HPF POC NONE SEEN NONE SEEN   Trich, Wet Prep NONE SEEN NONE SEEN   Clue Cells Wet Prep HPF POC PRESENT (A)  NONE SEEN   WBC, Wet Prep HPF POC FEW (A) NONE SEEN   Sperm NONE SEEN   CBC     Status: Abnormal   Collection Time: 01/30/19 11:47 PM  Result Value Ref Range   WBC 13.0 (H) 4.0 - 10.5 K/uL   RBC 4.09 3.87 - 5.11 MIL/uL   Hemoglobin 11.7 (L) 12.0 - 15.0 g/dL   HCT 09.835.7 (L) 11.936.0 - 14.746.0 %   MCV 87.3 80.0 - 100.0 fL   MCH 28.6 26.0 - 34.0 pg   MCHC 32.8 30.0 - 36.0 g/dL   RDW 82.913.2 56.211.5 - 13.015.5 %   Platelets 320 150 - 400 K/uL   nRBC 0.0 0.0 - 0.2 %  hCG, quantitative, pregnancy     Status: None   Collection Time: 01/30/19 11:47 PM  Result Value Ref Range   hCG, Beta Chain, Quant, S 1 <5 mIU/mL   [place] Ob Less Than 14 Weeks With Ob Transvaginal  Result Date: 01/31/2019 CLINICAL DATA:  Initial evaluation for acute vaginal bleeding, early pregnancy. EXAM: OBSTETRIC <14 WK US AND TRANSVAGINAL OB US TECHNIQUE: Both transabdominal and transvaginal ultrasound examinations were performed for complete evaluation of the gestation as well as the maternal uterus, adnexal regions, and pelvic cul-de-sac. Transvaginal technique was performed to assess early pregnancy. COMPARISON:  None available. FINDINGS: Intrauterine gestational sac: None visualized. Endometrial stripe thickened up to 2.4 cm with heterogeneous echotexture. Yolk sac:  Negative. Embryo:  Negative. Cardiac Activity: Negative. Heart Rate: N/A  bpm Subchorionic hemorrhage:  None  visualized. Maternal uterus/adnexae: Ovaries are normal in appearance bilaterally. No adnexal mass. No free fluid. IMPRESSION: 1. Early pregnancy with no discrete IUP or adnexal mass identified. Finding is consistent with a pregnancy of unknown anatomic location. Differential considerations include IUP to early to visualize, recent SAB, or possibly occult ectopic pregnancy. Close clinical monitoring with serial beta HCGs and close interval follow-up ultrasound recommended as clinically warranted. 2. No other acute maternal uterine or adnexal abnormality identified. Electronically Signed   By: Rise MuBenjamin  McClintock M.D.   On: 01/31/2019 00:50   MAU Course  Procedures  MDM -r/o ectopic -UA: amber/cloudy/lg hgb/100PRO/mod leuks/rare bacteria, sending urine for culture -CBC: WNL -US: endometrial stripe thickened up to 2.4cm and heterogeneous, no IUP, no abnormalities noted -hCG: 1 (pt had negative urine pregnancy test, but positive QUALITATIVE hCG test on 01/07/2019 in clinic) -ABO: O Positive -WetPrep: +ClueCells (isolated finding, not requiring treatment), few WBCs -GC/CT collected -after results showing hCG of 1, provider discussed history with pt more in-depth. Pt reports having medical TAB 01/02/2019. Pt reports she took a f/u pregnancy test after the procedure on 01/17/2019 which was negative. Pt then reports taking home pregnancy tests on 01/29/2019 that were positive. Pt reports unprotected intercourse between TAB and today and denies having a period between TAB and bleeding today. Pt advised this is likely her period, but she should f/u with her OB/GYN to discuss further f/u based on fluctuating positive and negative pregnancy tests post-AB. Pt took news of lack of pregnancy well and without concern. -pt discharged to home in stable condition   Orders Placed This Encounter  Procedures  . Wet prep, genital    Standing Status:   Standing    Number of Occurrences:   1  . Culture, OB Urine     Standing Status:   Standing    Number of Occurrences:   1  . US OB LESS THAN 14 WEEKS WITH OB TRANSVAGINAL    Standing Status:  Standing    Number of Occurrences:   1    Order Specific Question:   Symptom/Reason for Exam    Answer:   Vaginal bleeding in pregnancy [[id number]]  . Urinalysis, Routine w reflex microscopic    Standing Status:   Standing    Number of Occurrences:   1  . CBC    Standing Status:   Standing    Number of Occurrences:   1  . hCG, quantitative, pregnancy    Standing Status:   Standing    Number of Occurrences:   1  . Discharge patient    Order Specific Question:   Discharge disposition    Answer:   01-Home or Self Care [1]    Order Specific Question:   Discharge patient date    Answer:   01/31/2019   No orders of the defined types were placed in this encounter.  Assessment and Plan   1. Vaginal bleeding   2. Vaginal bleeding in pregnancy   3. Pregnancy test negative    Allergies as of 01/31/2019      Reactions   Morphine And Related Hives   Morphine And Related    hives      Medication List    TAKE these medications   amoxicillin-clavulanate 875-125 MG tablet Commonly known as: Augmentin Take 1 tablet by mouth 2 (two) times daily.   Biotin 10 MG Tabs Take by mouth.   clindamycin 1 % lotion Commonly known as: Cleocin-T Apply topically 2 (two) times daily.   clotrimazole 1 % cream Commonly known as: LOTRIMIN Apply 1 application topically 2 (two) times daily.   multivitamin with minerals tablet Take 1 tablet by mouth daily.   predniSONE 10 MG (21) Tbpk tablet Commonly known as: STERAPRED UNI-PAK 21 TAB Take as directed.   Vitafol Gummies 3.33-0.333-34.8 MG Chew Chew 1 tablet by mouth daily.   vitamin C 1000 MG tablet Take 1,000 mg by mouth daily.      -will call with culture results, if positive -list of OB/GYN providers given, pt advised to f/u with OB/GYN in next couple of weeks to evaluate fluctuating positive and negative  pregnancy tests -return MAU precautions given -pt discharged to home in stable condition  Elmyra Ricks E Nugent 01/31/2019, 1:20 AM

## 2019-01-30 NOTE — MAU Note (Signed)
PT SAYS  SHE HAD TAB ON 5-19.  THEN WENT TO [facility] ON 5-24 HAD BLOOD TEST - POSITIVE PREG .   YESTERDAY SHE STARTED BRIGHT RED BLEEDING-  SOME MILD CRAMPS - NO MEDS .  PAD ON IN TRIAGE - LIGHT SPOTS ON PAD.

## 2019-01-31 ENCOUNTER — Inpatient Hospital Stay (HOSPITAL_COMMUNITY): Payer: Managed Care, Other (non HMO)

## 2019-01-31 DIAGNOSIS — Z3202 Encounter for pregnancy test, result negative: Secondary | ICD-10-CM

## 2019-01-31 DIAGNOSIS — N939 Abnormal uterine and vaginal bleeding, unspecified: Secondary | ICD-10-CM

## 2019-01-31 LAB — CBC
HCT: 35.7 % — ABNORMAL LOW (ref 36.0–46.0)
Hemoglobin: 11.7 g/dL — ABNORMAL LOW (ref 12.0–15.0)
MCH: 28.6 pg (ref 26.0–34.0)
MCHC: 32.8 g/dL (ref 30.0–36.0)
MCV: 87.3 fL (ref 80.0–100.0)
Platelets: 320 10*3/uL (ref 150–400)
RBC: 4.09 MIL/uL (ref 3.87–5.11)
RDW: 13.2 % (ref 11.5–15.5)
WBC: 13 10*3/uL — ABNORMAL HIGH (ref 4.0–10.5)
nRBC: 0 % (ref 0.0–0.2)

## 2019-01-31 LAB — HCG, QUANTITATIVE, PREGNANCY: hCG, Beta Chain, Quant, S: 1 m[IU]/mL (ref <5)

## 2019-01-31 NOTE — Discharge Instructions (Signed)
Home Pregnancy Test Information  A home pregnancy test helps you determine whether you are pregnant or not. There are several types of home pregnancy tests that can be bought at a grocery store or pharmacy. What is being tested? A home pregnancy test detects the presence of a hormone in your urine. The hormone is produced by cells of the placenta (human chorionic gonadotropin, or hCG). The placenta is the organ that forms to nourish and support a developing baby. How are pregnancy tests done? Home pregnancy tests require a urine sample.  Most kits use a plastic testing device with a strip of paper that indicates whether there is hCG in your urine.  Follow the test package instructions very carefully for how to test your urine. Depending on the test, you may need to: ? Urinate directly onto the stick. ? Urinate into a cup.  Wait for the results as directed by the package instructions. The amount of time may be different for each type of test.  Follow the test package instructions for how to read your test results. Depending on the test, results may be displayed as: ? A plus or a minus sign. ? One or two lines. ? "Pregnant" or "not pregnant."  For best results, use your first urine of the morning. That is when the concentration of hCG is highest. How accurate are home pregnancy tests? Home pregnancy tests are very accurate when:  You are at least 3-[redacted] weeks pregnant.  It has been 1-2 weeks since your missed period.  You use the test according to the package instructions. What can interfere with home pregnancy test results? Sometimes, a home pregnancy test may report that you are pregnant when you are not pregnant (false-positive result). This can happen if you:  Are taking certain medicines, such as: ? Medicine to control seizures. ? Anti-anxiety medicine. ? Fertility medicine with hCG.  Have a medical condition that affects your hormone levels.  Had a recent pregnancy loss  (miscarriage) or abortion. Sometimes, a home pregnancy test may report that you are not pregnant when you are pregnant (false-negative result). This can happen if you:  Took the test too early in your pregnancy. Before 3-4 weeks of pregnancy, there may not be enough hCG to detect.  Drank a lot of liquid before the test.  Used an expired pregnancy test.  Are taking certain medicines, such as antihistamines or water pills (diuretics). What should I do if I have a positive pregnancy test? If you have a positive home pregnancy test, schedule an appointment with your health care provider. You might need additional testing to confirm the pregnancy. What should I do if I have a negative pregnancy test? If you have a negative home pregnancy test but still have symptoms of pregnancy, contact your health care provider. Your health care provider will test a sample of your blood to check for pregnancy. In some cases, a blood test will return a positive result even if a urine test was negative because blood tests are more sensitive. This means blood tests can detect hCG earlier than home pregnancy tests. Follow these instructions at home: If you are pregnant, planning to become pregnant, or think you may be pregnant:  Do not drink alcohol.  Do not use street drugs.  Do not use any products that contain nicotine or tobacco, such as cigarettes and e-cigarettes. If you need help quitting, ask your health care provider.  Take a prenatal vitamin that contains at least 400 mcg of  folic acid daily. Summary  A home pregnancy test helps you determine whether you are pregnant or not by detecting the presence of the hormone human chorionic gonadotropin (hCG) in a sample of your urine.  Follow the test package instructions very carefully. For best results, use your first urine of the morning. That is when the concentration of hCG is highest.  Home pregnancy tests are very accurate when you are 3-[redacted] weeks  pregnant or when it has been 1-2 weeks since your missed period.  A home pregnancy test may report that you are pregnant when you are not pregnant or that you are not pregnant when you are pregnant.  Contact your health care provider to confirm your results. Your health care provider will test a sample of your blood to check for pregnancy. This information is not intended to replace advice given to you by your health care provider. Make sure you discuss any questions you have with your health care provider. Document Released: 07/05/2003 Document Revised: 10/23/2018 Document Reviewed: 07/15/2017 Elsevier Patient Education  2020 [employer] [place] [facility] Providers     [facility]     Phone: [phone]  [facility]  Phone: [phone]  [facility] [place]  Phone: [phone]  [facility] [place]                           Phone: [phone]  [facility]          Phone: [phone]  [facility]    Phone: [phone]   [facility]    Phone: [phone]  [facility]    Phone: [phone]  [facility]    Phone: [phone]  [facility]    Phone: [phone]  [facility]  Phone: [phone]  [facility]               Phone: [phone]  [facility]   Phone: [phone]  [facility]    Phone: [phone]

## 2019-02-01 LAB — CULTURE, OB URINE

## 2019-02-04 LAB — GC/CHLAMYDIA PROBE AMP (~~LOC~~) NOT AT ARMC
Chlamydia: NEGATIVE
Neisseria Gonorrhea: NEGATIVE

## 2019-02-12 ENCOUNTER — Other Ambulatory Visit: Payer: Self-pay

## 2019-02-12 DIAGNOSIS — Z20822 Contact with and (suspected) exposure to covid-19: Secondary | ICD-10-CM

## 2019-02-14 LAB — NOVEL CORONAVIRUS, NAA: SARS-CoV-2, NAA: NOT DETECTED

## 2019-02-19 ENCOUNTER — Encounter: Payer: Commercial Managed Care - PPO | Admitting: Certified Nurse Midwife

## 2019-02-24 ENCOUNTER — Encounter: Payer: Managed Care, Other (non HMO) | Admitting: Obstetrics and Gynecology

## 2019-03-12 ENCOUNTER — Other Ambulatory Visit: Payer: Self-pay

## 2019-03-12 DIAGNOSIS — Z20822 Contact with and (suspected) exposure to covid-19: Secondary | ICD-10-CM

## 2019-03-14 LAB — NOVEL CORONAVIRUS, NAA: SARS-CoV-2, NAA: NOT DETECTED

## 2019-06-04 ENCOUNTER — Other Ambulatory Visit: Payer: Self-pay

## 2019-06-04 DIAGNOSIS — Z20822 Contact with and (suspected) exposure to covid-19: Secondary | ICD-10-CM

## 2019-06-06 LAB — NOVEL CORONAVIRUS, NAA: SARS-CoV-2, NAA: NOT DETECTED

## 2019-07-06 ENCOUNTER — Ambulatory Visit: Payer: Managed Care, Other (non HMO) | Admitting: Obstetrics

## 2019-07-22 ENCOUNTER — Ambulatory Visit: Payer: Managed Care, Other (non HMO) | Admitting: Obstetrics

## 2019-08-03 ENCOUNTER — Other Ambulatory Visit: Payer: Self-pay

## 2019-08-03 ENCOUNTER — Ambulatory Visit (INDEPENDENT_AMBULATORY_CARE_PROVIDER_SITE_OTHER): Payer: Managed Care, Other (non HMO) | Admitting: Obstetrics

## 2019-08-03 ENCOUNTER — Encounter: Payer: Self-pay | Admitting: Obstetrics

## 2019-08-03 VITALS — BP 127/78 | HR 71 | Ht 67.0 in | Wt 267.0 lb

## 2019-08-03 DIAGNOSIS — F329 Major depressive disorder, single episode, unspecified: Secondary | ICD-10-CM

## 2019-08-03 DIAGNOSIS — N898 Other specified noninflammatory disorders of vagina: Secondary | ICD-10-CM | POA: Diagnosis not present

## 2019-08-03 DIAGNOSIS — B373 Candidiasis of vulva and vagina: Secondary | ICD-10-CM | POA: Diagnosis not present

## 2019-08-03 DIAGNOSIS — Z113 Encounter for screening for infections with a predominantly sexual mode of transmission: Secondary | ICD-10-CM

## 2019-08-03 DIAGNOSIS — Z01419 Encounter for gynecological examination (general) (routine) without abnormal findings: Secondary | ICD-10-CM

## 2019-08-03 DIAGNOSIS — Z131 Encounter for screening for diabetes mellitus: Secondary | ICD-10-CM

## 2019-08-03 DIAGNOSIS — Z1151 Encounter for screening for human papillomavirus (HPV): Secondary | ICD-10-CM | POA: Diagnosis not present

## 2019-08-03 DIAGNOSIS — F32A Depression, unspecified: Secondary | ICD-10-CM

## 2019-08-03 DIAGNOSIS — Z3009 Encounter for other general counseling and advice on contraception: Secondary | ICD-10-CM

## 2019-08-03 DIAGNOSIS — F419 Anxiety disorder, unspecified: Secondary | ICD-10-CM

## 2019-08-03 DIAGNOSIS — Z124 Encounter for screening for malignant neoplasm of cervix: Secondary | ICD-10-CM

## 2019-08-03 MED ORDER — ESCITALOPRAM OXALATE 10 MG PO TABS: 10.0000 mg | ORAL_TABLET | Freq: Every day | ORAL | 5 refills | Status: DC

## 2019-08-03 NOTE — Progress Notes (Signed)
Subjective:        Jacqueline Haas is a 34 y.o. female here for a routine exam.  Current complaints: Malodorous vaginal discharge.  Significant anxiety / depression over the stresses of job.  Personal health questionnaire:  Is patient Ashkenazi Jewish, have a family history of breast and/or ovarian cancer: no Is there a family history of uterine cancer diagnosed at age < 9, gastrointestinal cancer, urinary tract cancer, family member who is a Personnel officer syndrome-associated carrier: no Is the patient overweight and hypertensive, family history of diabetes, personal history of gestational diabetes, preeclampsia or PCOS: no Is patient over 64, have PCOS,  family history of premature CHD under age 91, diabetes, smoke, have hypertension or peripheral artery disease:  no At any time, has a partner hit, kicked or otherwise hurt or frightened you?: no Over the past 2 weeks, have you felt down, depressed or hopeless?: no Over the past 2 weeks, have you felt little interest or pleasure in doing things?:no   Gynecologic History Patient's last menstrual period was 08/03/2019. Contraception: none Last Pap: 05-10-2017. Results were: normal Last mammogram: n/a. Results were: n/a  Obstetric History OB History  Gravida Para Term Preterm AB Living  4 2 2  0 1 2  SAB TAB Ectopic Multiple Live Births  0 1 0   2    # Outcome Date GA Lbr Len/2nd Weight Sex Delivery Anes PTL Lv  4 Gravida           3 Term      Vag-Spont   LIV  2 Term      CS-LTranv   LIV  1 TAB         DEC    Past Medical History:  Diagnosis Date  . UTI (urinary tract infection)     Past Surgical History:  Procedure Laterality Date  . CESAREAN SECTION    . TONSILLECTOMY       Current Outpatient Medications:  .  Ascorbic Acid (VITAMIN C) 1000 MG tablet, Take 1,000 mg by mouth daily., Disp: , Rfl:  .  Biotin 10 MG TABS, Take by mouth., Disp: , Rfl:  .  clindamycin (CLEOCIN-T) 1 % lotion, Apply topically 2 (two) times  daily. (Patient not taking: Reported on 01/07/2019), Disp: 60 mL, Rfl: 4 .  clotrimazole (LOTRIMIN) 1 % cream, Apply 1 application topically 2 (two) times daily. (Patient not taking: Reported on 01/07/2019), Disp: 113 g, Rfl: 1 .  escitalopram (LEXAPRO) 10 MG tablet, Take 1 tablet (10 mg total) by mouth daily., Disp: 30 tablet, Rfl: 5 .  Multiple Vitamins-Minerals (MULTIVITAMIN WITH MINERALS) tablet, Take 1 tablet by mouth daily., Disp: , Rfl:  .  Prenatal Vit-Fe Phos-FA-Omega (VITAFOL GUMMIES) 3.33-0.333-34.8 MG CHEW, Chew 1 tablet by mouth daily., Disp: 90 tablet, Rfl: 12  Current Facility-Administered Medications:  .  etonogestrel (NEXPLANON) implant 68 mg, 68 mg, Subdermal, Once, 08-28-1979, MD Allergies  Allergen Reactions  . Morphine And Related Hives  . Morphine And Related     hives    Social History   Tobacco Use  . Smoking status: Former Smoker    Types: E-cigarettes    Quit date: 01/30/2015    Years since quitting: 4.5  . Smokeless tobacco: Never Used  . Tobacco comment: Hookah  Substance Use Topics  . Alcohol use: Yes    Comment: last adult beverage 22 june 20    History reviewed. No pertinent family history.    Review of Systems  Constitutional: negative  for fatigue and weight loss Respiratory: negative for cough and wheezing Cardiovascular: negative for chest pain, fatigue and palpitations Gastrointestinal: negative for abdominal pain and change in bowel habits Musculoskeletal:negative for myalgias Neurological: negative for gait problems and tremors Behavioral/Psych: negative for abusive relationship, depression Endocrine: negative for temperature intolerance    Genitourinary:negative for abnormal menstrual periods, genital lesions, hot flashes, sexual problems and vaginal discharge Integument/breast: negative for breast lump, breast tenderness, nipple discharge and skin lesion(s)    Objective:       BP 127/78   Pulse 71   Ht 5\' 7"  (1.702 m)   Wt  267 lb (121.1 kg)   LMP 08/03/2019 Comment: abortion on 19 May 20  Breastfeeding Unknown   BMI 41.82 kg/m  General:   alert  Skin:   no rash or abnormalities  Lungs:   clear to auscultation bilaterally  Heart:   regular rate and rhythm, S1, S2 normal, no murmur, click, rub or gallop  Breasts:   normal without suspicious masses, skin or nipple changes or axillary nodes  Abdomen:  normal findings: no organomegaly, soft, non-tender and no hernia  Pelvis:  External genitalia: normal general appearance Urinary system: urethral meatus normal and bladder without fullness, nontender Vaginal: normal without tenderness, induration or masses Cervix: normal appearance Adnexa: normal bimanual exam Uterus: anteverted and non-tender, normal size   Lab Review Urine pregnancy test Labs reviewed yes Radiologic studies reviewed no  50% of 25 min visit spent on counseling and coordination of care.   Assessment:     1. Encounter for routine gynecological examination with Papanicolaou smear of cervix Rx: - Cytology - PAP( [facility]) - TSH - CBC - Comprehensive metabolic panel  2. Vaginal discharge Rx: - Cervicovaginal ancillary only( [facility])  3. Screen for STD (sexually transmitted disease) Rx: - HIV Antibody (routine testing w rflx) - Hepatitis B surface antigen - RPR - Hepatitis C antibody  4. Anxiety and depression Rx: - escitalopram (LEXAPRO) 10 MG tablet; Take 1 tablet (10 mg total) by mouth daily.  Dispense: 30 tablet; Refill: 5  5. Screening for diabetes mellitus Rx: - Hemoglobin A1c  6. Encounter for other general counseling or advice on contraception - not sexually active.  Declines contraception    Plan:    Education reviewed: calcium supplements, depression evaluation, low fat, low cholesterol diet, safe sex/STD prevention, self breast exams and weight bearing exercise. Contraception: none. Follow up in: 3 months.   Meds ordered this encounter  Medications   . escitalopram (LEXAPRO) 10 MG tablet    Sig: Take 1 tablet (10 mg total) by mouth daily.    Dispense:  30 tablet    Refill:  5   Orders Placed This Encounter  Procedures  . HIV Antibody (routine testing w rflx)  . Hepatitis B surface antigen  . RPR  . Hepatitis C antibody  . Hemoglobin A1c  . TSH  . CBC  . Comprehensive metabolic panel    Shelly Bombard, MD 08/03/2019 3:26 PM

## 2019-08-04 ENCOUNTER — Other Ambulatory Visit: Payer: Self-pay | Admitting: Obstetrics

## 2019-08-04 DIAGNOSIS — B373 Candidiasis of vulva and vagina: Secondary | ICD-10-CM

## 2019-08-04 DIAGNOSIS — B3731 Acute candidiasis of vulva and vagina: Secondary | ICD-10-CM

## 2019-08-04 LAB — CERVICOVAGINAL ANCILLARY ONLY
Bacterial Vaginitis (gardnerella): NEGATIVE
Candida Glabrata: NEGATIVE
Candida Vaginitis: POSITIVE — AB
Chlamydia: NEGATIVE
Comment: NEGATIVE
Comment: NEGATIVE
Comment: NEGATIVE
Comment: NEGATIVE
Comment: NEGATIVE
Comment: NORMAL
Neisseria Gonorrhea: NEGATIVE
Trichomonas: NEGATIVE

## 2019-08-04 LAB — CBC
Hematocrit: 33.6 % — ABNORMAL LOW (ref 34.0–46.6)
Hemoglobin: 11 g/dL — ABNORMAL LOW (ref 11.1–15.9)
MCH: 28 pg (ref 26.6–33.0)
MCHC: 32.7 g/dL (ref 31.5–35.7)
MCV: 86 fL (ref 79–97)
Platelets: 251 10*3/uL (ref 150–450)
RBC: 3.93 x10E6/uL (ref 3.77–5.28)
RDW: 13 % (ref 11.7–15.4)
WBC: 12 10*3/uL — ABNORMAL HIGH (ref 3.4–10.8)

## 2019-08-04 LAB — COMPREHENSIVE METABOLIC PANEL
ALT: 11 IU/L (ref 0–32)
AST: 14 IU/L (ref 0–40)
Albumin/Globulin Ratio: 1.4 (ref 1.2–2.2)
Albumin: 4.1 g/dL (ref 3.8–4.8)
Alkaline Phosphatase: 64 IU/L (ref 39–117)
BUN/Creatinine Ratio: 12 (ref 9–23)
BUN: 10 mg/dL (ref 6–20)
Bilirubin Total: <0.2 mg/dL (ref 0.0–1.2)
CO2: 23 mmol/L (ref 20–29)
Calcium: 9.6 mg/dL (ref 8.7–10.2)
Chloride: 104 mmol/L (ref 96–106)
Creatinine, Ser: 0.82 mg/dL (ref 0.57–1.00)
GFR calc Af Amer: 110 mL/min/{1.73_m2} (ref >59)
GFR calc non Af Amer: 96 mL/min/{1.73_m2} (ref >59)
Globulin, Total: 3 g/dL (ref 1.5–4.5)
Glucose: 78 mg/dL (ref 65–99)
Potassium: 4.3 mmol/L (ref 3.5–5.2)
Sodium: 139 mmol/L (ref 134–144)
Total Protein: 7.1 g/dL (ref 6.0–8.5)

## 2019-08-04 LAB — HEPATITIS B SURFACE ANTIGEN: Hepatitis B Surface Ag: NEGATIVE

## 2019-08-04 LAB — HIV ANTIBODY (ROUTINE TESTING W REFLEX): HIV Screen 4th Generation wRfx: NONREACTIVE

## 2019-08-04 LAB — HEMOGLOBIN A1C
Est. average glucose Bld gHb Est-mCnc: 103 mg/dL
Hgb A1c MFr Bld: 5.2 % (ref 4.8–5.6)

## 2019-08-04 LAB — HEPATITIS C ANTIBODY: Hep C Virus Ab: 0.1 s/co ratio (ref 0.0–0.9)

## 2019-08-04 LAB — RPR: RPR Ser Ql: NONREACTIVE

## 2019-08-04 LAB — TSH: TSH: 1.46 u[IU]/mL (ref 0.450–4.500)

## 2019-08-04 MED ORDER — FLUCONAZOLE 150 MG PO TABS: 150.0000 mg | ORAL_TABLET | Freq: Once | ORAL | 0 refills | Status: AC

## 2019-08-05 LAB — CYTOLOGY - PAP
Comment: NEGATIVE
Diagnosis: NEGATIVE
High risk HPV: NEGATIVE

## 2019-08-26 ENCOUNTER — Other Ambulatory Visit: Payer: Self-pay | Admitting: Obstetrics

## 2019-08-26 DIAGNOSIS — F419 Anxiety disorder, unspecified: Secondary | ICD-10-CM

## 2019-08-26 DIAGNOSIS — F329 Major depressive disorder, single episode, unspecified: Secondary | ICD-10-CM

## 2019-08-26 NOTE — Telephone Encounter (Signed)
Refill from pharmacy for:   Name from pharmacy: ESCITALOPRAM 10 MG TABLET       Will file in chart as: escitalopram (LEXAPRO) 10 MG tablet   Sig: TAKE 1 TABLET BY MOUTH EVERY DAY   Disp:  90 tablet    Refills:  2   Start: 08/26/2019   Class: Normal   Non-formulary For: Anxiety and depression   Last ordered: 3 weeks ago by Brock Bad, MD Last refill: 08/03/2019   Rx #: [id number]   Pharmacy comment: REQUEST FOR 90 DAYS PRESCRIPTION. DX Code Needed.     To be filled at: [id number] - [place], [place] - [address]

## 2019-09-02 ENCOUNTER — Ambulatory Visit: Payer: Managed Care, Other (non HMO) | Attending: Internal Medicine

## 2019-09-02 DIAGNOSIS — Z20822 Contact with and (suspected) exposure to covid-19: Secondary | ICD-10-CM

## 2019-09-03 LAB — NOVEL CORONAVIRUS, NAA: SARS-CoV-2, NAA: NOT DETECTED

## 2019-09-04 ENCOUNTER — Telehealth: Payer: Self-pay | Admitting: General Practice

## 2019-09-04 NOTE — Telephone Encounter (Signed)
Patient informed of negative covid-19 result. Patient verbalized understanding.  °

## 2019-10-19 ENCOUNTER — Ambulatory Visit: Payer: Managed Care, Other (non HMO) | Attending: Internal Medicine

## 2019-10-19 DIAGNOSIS — Z20822 Contact with and (suspected) exposure to covid-19: Secondary | ICD-10-CM

## 2019-10-21 ENCOUNTER — Other Ambulatory Visit: Payer: Managed Care, Other (non HMO)

## 2019-10-21 LAB — SARS-COV-2, NAA 2 DAY TAT

## 2019-10-21 LAB — NOVEL CORONAVIRUS, NAA: SARS-CoV-2, NAA: NOT DETECTED

## 2019-11-04 ENCOUNTER — Ambulatory Visit: Payer: Managed Care, Other (non HMO) | Attending: Internal Medicine

## 2019-11-04 DIAGNOSIS — Z20822 Contact with and (suspected) exposure to covid-19: Secondary | ICD-10-CM

## 2019-11-05 LAB — SARS-COV-2, NAA 2 DAY TAT

## 2019-11-05 LAB — NOVEL CORONAVIRUS, NAA: SARS-CoV-2, NAA: NOT DETECTED

## 2020-08-01 ENCOUNTER — Encounter: Payer: Self-pay | Admitting: Obstetrics and Gynecology

## 2020-08-03 ENCOUNTER — Encounter: Payer: Self-pay | Admitting: Obstetrics and Gynecology

## 2020-08-03 ENCOUNTER — Ambulatory Visit (INDEPENDENT_AMBULATORY_CARE_PROVIDER_SITE_OTHER): Payer: No Typology Code available for payment source | Admitting: Obstetrics and Gynecology

## 2020-08-03 ENCOUNTER — Other Ambulatory Visit (HOSPITAL_COMMUNITY)
Admission: RE | Admit: 2020-08-03 | Discharge: 2020-08-03 | Disposition: A | Discharge status: Home / Self Care | Payer: No Typology Code available for payment source | Source: Ambulatory Visit | Attending: Obstetrics and Gynecology | Admitting: Obstetrics and Gynecology

## 2020-08-03 ENCOUNTER — Other Ambulatory Visit: Payer: Self-pay

## 2020-08-03 DIAGNOSIS — Z3480 Encounter for supervision of other normal pregnancy, unspecified trimester: Secondary | ICD-10-CM | POA: Insufficient documentation

## 2020-08-03 DIAGNOSIS — Z98891 History of uterine scar from previous surgery: Secondary | ICD-10-CM

## 2020-08-03 DIAGNOSIS — O99210 Obesity complicating pregnancy, unspecified trimester: Secondary | ICD-10-CM | POA: Insufficient documentation

## 2020-08-03 DIAGNOSIS — Z3A13 13 weeks gestation of pregnancy: Secondary | ICD-10-CM | POA: Insufficient documentation

## 2020-08-03 DIAGNOSIS — O219 Vomiting of pregnancy, unspecified: Secondary | ICD-10-CM | POA: Insufficient documentation

## 2020-08-03 DIAGNOSIS — O99211 Obesity complicating pregnancy, first trimester: Secondary | ICD-10-CM

## 2020-08-03 MED ORDER — DOXYLAMINE-PYRIDOXINE 10-10 MG PO TBEC: 2.0000 | DELAYED_RELEASE_TABLET | Freq: Every day | ORAL | 5 refills | Status: DC

## 2020-08-03 NOTE — Patient Instructions (Signed)
https://www.cdc.gov/pregnancy/infections.html">  First Trimester of Pregnancy  The first trimester of pregnancy starts on the first day of your last menstrual period until the end of week 12. This is also called months 1 through 3 of pregnancy. Body changes during your first trimester Your body goes through many changes during pregnancy. The changes usually return to normal after your baby is born. Physical changes  You may gain or lose weight.  Your breasts may grow larger and hurt. The area around your nipples may get darker.  Dark spots or blotches may develop on your face.  You may have changes in your hair. Health changes  You may feel like you might vomit (nauseous), and you may vomit.  You may have heartburn.  You may have headaches.  You may have trouble pooping (constipation).  Your gums may bleed. Other changes  You may get tired easily.  You may pee (urinate) more often.  Your menstrual periods will stop.  You may not feel hungry.  You may want to eat certain kinds of food.  You may have changes in your emotions from day to day.  You may have more dreams. Follow these instructions at home: Medicines  Take over-the-counter and prescription medicines only as told by your doctor. Some medicines are not safe during pregnancy.  Take a prenatal vitamin that contains at least 600 micrograms (mcg) of folic acid. Eating and drinking  Eat healthy meals that include: ? Fresh fruits and vegetables. ? Whole grains. ? Good sources of protein, such as meat, eggs, or tofu. ? Low-fat dairy products.  Avoid raw meat and unpasteurized juice, milk, and cheese.  If you feel like you may vomit, or you vomit: ? Eat 4 or 5 small meals a day instead of 3 large meals. ? Try eating a few soda crackers. ? Drink liquids between meals instead of during meals.  You may need to take these actions to prevent or treat trouble pooping: ? Drink enough fluids to keep your pee  (urine) pale yellow. ? Eat foods that are high in fiber. These include beans, whole grains, and fresh fruits and vegetables. ? Limit foods that are high in fat and sugar. These include fried or sweet foods. Activity  Exercise only as told by your doctor. Most people can do their usual exercise routine during pregnancy.  Stop exercising if you have cramps or pain in your lower belly (abdomen) or low back.  Do not exercise if it is too hot or too humid, or if you are in a place of great height (high altitude).  Avoid heavy lifting.  If you choose to, you may have sex unless your doctor tells you not to. Relieving pain and discomfort  Wear a good support bra if your breasts are sore.  Rest with your legs raised (elevated) if you have leg cramps or low back pain.  If you have bulging veins (varicose veins) in your legs: ? Wear support hose as told by your doctor. ? Raise your feet for 15 minutes, 3-4 times a day. ? Limit salt in your food. Safety  Wear your seat belt at all times when you are in a car.  Talk with your doctor if someone is hurting you or yelling at you.  Talk with your doctor if you are feeling sad or have thoughts of hurting yourself. Lifestyle  Do not use hot tubs, steam rooms, or saunas.  Do not douche. Do not use tampons or scented sanitary pads.  Do not   use herbal medicines, illegal drugs, or medicines that are not approved by your doctor. Do not drink alcohol.  Do not smoke or use any products that contain nicotine or tobacco. If you need help quitting, ask your doctor.  Avoid cat litter boxes and soil that is used by cats. These carry germs that can cause harm to the baby and can cause a loss of your baby by miscarriage or stillbirth. General instructions  Keep all follow-up visits. This is important.  Ask for help if you need counseling or if you need help with nutrition. Your doctor can give you advice or tell you where to go for help.  Visit your  dentist. At home, brush your teeth with a soft toothbrush. Floss gently.  Write down your questions. Take them to your prenatal visits. Where to find more information  American Pregnancy Association: americanpregnancy.org  American College of Obstetricians and Gynecologists: www.acog.org  Office on Women's Health: womenshealth.gov/pregnancy Contact a doctor if:  You are dizzy.  You have a fever.  You have mild cramps or pressure in your lower belly.  You have a nagging pain in your belly area.  You continue to feel like you may vomit, you vomit, or you have watery poop (diarrhea) for 24 hours or longer.  You have a bad-smelling fluid coming from your vagina.  You have pain when you pee.  You are exposed to a disease that spreads from person to person, such as chickenpox, measles, Zika virus, HIV, or hepatitis. Get help right away if:  You have spotting or bleeding from your vagina.  You have very bad belly cramping or pain.  You have shortness of breath or chest pain.  You have any kind of injury, such as from a fall or a car crash.  You have new or increased pain, swelling, or redness in an arm or leg. Summary  The first trimester of pregnancy starts on the first day of your last menstrual period until the end of week 12 (months 1 through 3).  Eat 4 or 5 small meals a day instead of 3 large meals.  Do not smoke or use any products that contain nicotine or tobacco. If you need help quitting, ask your doctor.  Keep all follow-up visits. This information is not intended to replace advice given to you by your health care provider. Make sure you discuss any questions you have with your health care provider. Document Revised: 12/09/2019 Document Reviewed: 10/15/2019 Elsevier Patient Education  2021 Elsevier Inc.  

## 2020-08-03 NOTE — Progress Notes (Signed)
INITIAL PRENATAL VISIT NOTE  Subjective:  Jacqueline Haas is a 35 y.o. [id number] at [redacted]w[redacted]d by LMP being seen today for her initial prenatal visit. This is a planned pregnancy.  She was using nothing for birth control previously. She has an obstetric history significant for previous c section. She has a medical history significant for nothing.  Patient reports no complaints.  Contractions: Not present. Vag. Bleeding: None.   . Denies leaking of fluid.    Past Medical History:  Diagnosis Date  . UTI (urinary tract infection)     Past Surgical History:  Procedure Laterality Date  . CESAREAN SECTION    . TONSILLECTOMY      OB History  Gravida Para Term Preterm AB Living  4 2 2  0 1 2  SAB IAB Ectopic Multiple Live Births  0 1 0   2    # Outcome Date GA Lbr Len/2nd Weight Sex Delivery Anes PTL Lv  4 Current           3 Term 10/04/10     CS-LTranv   LIV  2 Term 11/17/08     Vag-Spont   LIV  1 IAB         DEC    Social History   Socioeconomic History  . Marital status: Single    Spouse name: Not on file  . Number of children: Not on file  . Years of education: Not on file  . Highest education level: Not on file  Occupational History  . Not on file  Tobacco Use  . Smoking status: Former Smoker    Types: E-cigarettes    Quit date: 01/30/2015    Years since quitting: 5.5  . Smokeless tobacco: Never Used  . Tobacco comment: Hookah  Substance and Sexual Activity  . Alcohol use: Not Currently    Comment: last adult beverage 22 june 20  . Drug use: No  . Sexual activity: Yes    Comment: last sex 15 July 20  Other Topics Concern  . Not on file  Social History Narrative   ** Merged History Encounter **       Social Determinants of Health   Financial Resource Strain: Not on file  Food Insecurity: Not on file  Transportation Needs: Not on file  Physical Activity: Not on file  Stress: Not on file  Social Connections: Not on file    History reviewed. No pertinent  family history.   Current Outpatient Medications:  .  Doxylamine-Pyridoxine (DICLEGIS) 10-10 MG TBEC, Take 2 tablets by mouth at bedtime. If symptoms persist, add one tablet in the morning and one in the afternoon, Disp: 100 tablet, Rfl: 5 .  Prenatal Vit-Fe Phos-FA-Omega (VITAFOL GUMMIES) 3.33-0.333-34.8 MG CHEW, Chew 1 tablet by mouth daily., Disp: 90 tablet, Rfl: 12 .  Ascorbic Acid (VITAMIN C) 1000 MG tablet, Take 1,000 mg by mouth daily., Disp: , Rfl:  .  Biotin 10 MG TABS, Take by mouth., Disp: , Rfl:  .  clindamycin (CLEOCIN-T) 1 % lotion, Apply topically 2 (two) times daily. (Patient not taking: Reported on 01/07/2019), Disp: 60 mL, Rfl: 4 .  clotrimazole (LOTRIMIN) 1 % cream, Apply 1 application topically 2 (two) times daily. (Patient not taking: Reported on 01/07/2019), Disp: 113 g, Rfl: 1 .  escitalopram (LEXAPRO) 10 MG tablet, TAKE 1 TABLET BY MOUTH EVERY DAY (Patient not taking: Reported on 08/03/2020), Disp: 90 tablet, Rfl: 2  Current Facility-Administered Medications:  .  etonogestrel (NEXPLANON) implant 68 mg,  68 mg, Subdermal, Once, Brock Bad, MD  Allergies  Allergen Reactions  . Morphine And Related Hives  . Morphine And Related     hives    Review of Systems: Negative except for what is mentioned in HPI.  Objective:   Vitals:   08/03/20 1423  BP: 127/82  Pulse: 98  Weight: 274 lb (124.3 kg)    Fetal Status: Fetal Heart Rate (bpm): 160         Physical Exam: BP 127/82   Pulse 98   Wt 274 lb (124.3 kg)   LMP 05/04/2020   BMI 42.91 kg/m  CONSTITUTIONAL: Well-developed, well-nourished female in no acute distress.  NEUROLOGIC: Alert and oriented to person, place, and time. Normal reflexes, muscle tone coordination. No cranial nerve deficit noted. PSYCHIATRIC: Normal mood and affect. Normal behavior. Normal judgment and thought content. SKIN: Skin is warm and dry. No rash noted. Not diaphoretic. No erythema. No pallor. HENT:  Normocephalic, atraumatic,  External right and left ear normal. Oropharynx is clear and moist EYES: Conjunctivae and EOM are normal. Pupils are equal, round, and reactive to light. No scleral icterus.  NECK: Normal range of motion, supple, no masses CARDIOVASCULAR: Normal heart rate noted, regular rhythm RESPIRATORY: Effort and breath sounds normal, no problems with respiration noted BREASTS: not examined ABDOMEN: Soft, nontender, obese, nondistended, gravid. GU: not examined Bimanual:n/a MUSCULOSKELETAL: Normal range of motion. EXT:  No edema and no tenderness. 2+ distal pulses.   Assessment and Plan:  Pregnancy: G4P2012 at [redacted]w[redacted]d by LMP  1. [redacted] weeks gestation of pregnancy   2. History of cesarean section Pt currently desires TOLAC  3. Nausea and vomiting during pregnancy  - Doxylamine-Pyridoxine (DICLEGIS) 10-10 MG TBEC; Take 2 tablets by mouth at bedtime. If symptoms persist, add one tablet in the morning and one in the afternoon  Dispense: 100 tablet; Refill: 5  4. Supervision of other normal pregnancy, antepartum  - Cervicovaginal ancillary only( [facility]) - CBC/D/Plt+RPR+Rh+ABO+Rub Ab... - Culture, OB Urine - Genetic Screening - US MFM OB DETAIL +14 WK; Future  5. Obesity affecting pregnancy in first trimester    Preterm labor symptoms and general obstetric precautions including but not limited to vaginal bleeding, contractions, leaking of fluid and fetal movement were reviewed in detail with the patient.  Please refer to After Visit Summary for other counseling recommendations.   Return in about 4 weeks (around 08/31/2020) for ROB, in person.  Warden Fillers 08/03/2020 10:46 PM

## 2020-08-03 NOTE — Progress Notes (Signed)
Pt is having daily Nausea and frequent HA's.

## 2020-08-04 LAB — CBC/D/PLT+RPR+RH+ABO+RUB AB...
Antibody Screen: NEGATIVE
Basophils Absolute: 0 10*3/uL (ref 0.0–0.2)
Basos: 0 %
EOS (ABSOLUTE): 0.3 10*3/uL (ref 0.0–0.4)
Eos: 2 %
HCV Ab: <0.1 s/co ratio (ref 0.0–0.9)
HIV Screen 4th Generation wRfx: NONREACTIVE
Hematocrit: 32.8 % — ABNORMAL LOW (ref 34.0–46.6)
Hemoglobin: 11.1 g/dL (ref 11.1–15.9)
Hepatitis B Surface Ag: NEGATIVE
Immature Grans (Abs): 0 10*3/uL (ref 0.0–0.1)
Immature Granulocytes: 0 %
Lymphocytes Absolute: 3.1 10*3/uL (ref 0.7–3.1)
Lymphs: 22 %
MCH: 27.3 pg (ref 26.6–33.0)
MCHC: 33.8 g/dL (ref 31.5–35.7)
MCV: 81 fL (ref 79–97)
Monocytes Absolute: 0.8 10*3/uL (ref 0.1–0.9)
Monocytes: 5 %
Neutrophils Absolute: 9.9 10*3/uL — ABNORMAL HIGH (ref 1.4–7.0)
Neutrophils: 71 %
Platelets: 391 10*3/uL (ref 150–450)
RBC: 4.06 x10E6/uL (ref 3.77–5.28)
RDW: 13.2 % (ref 11.7–15.4)
RPR Ser Ql: NONREACTIVE
Rh Factor: POSITIVE
Rubella Antibodies, IGG: 1.24 index (ref >0.99)
WBC: 14.1 10*3/uL — ABNORMAL HIGH (ref 3.4–10.8)

## 2020-08-04 LAB — CERVICOVAGINAL ANCILLARY ONLY
Chlamydia: NEGATIVE
Comment: NEGATIVE
Comment: NORMAL
Neisseria Gonorrhea: NEGATIVE

## 2020-08-04 LAB — HCV INTERPRETATION

## 2020-08-05 LAB — CULTURE, OB URINE

## 2020-08-05 LAB — URINE CULTURE, OB REFLEX

## 2020-08-11 ENCOUNTER — Encounter: Payer: Self-pay | Admitting: Obstetrics and Gynecology

## 2020-08-18 ENCOUNTER — Encounter: Payer: Self-pay | Admitting: Obstetrics and Gynecology

## 2020-09-01 ENCOUNTER — Ambulatory Visit (INDEPENDENT_AMBULATORY_CARE_PROVIDER_SITE_OTHER): Payer: No Typology Code available for payment source | Admitting: Obstetrics and Gynecology

## 2020-09-01 ENCOUNTER — Other Ambulatory Visit: Payer: Self-pay

## 2020-09-01 VITALS — BP 134/81 | HR 90 | Wt 279.0 lb

## 2020-09-01 DIAGNOSIS — Z3480 Encounter for supervision of other normal pregnancy, unspecified trimester: Secondary | ICD-10-CM

## 2020-09-01 DIAGNOSIS — O99211 Obesity complicating pregnancy, first trimester: Secondary | ICD-10-CM

## 2020-09-01 MED ORDER — VITAFOL GUMMIES 3.33-0.333-34.8 MG PO CHEW
1.0000 | CHEWABLE_TABLET | Freq: Every day | ORAL | 12 refills | Status: DC
Start: 2020-09-01 — End: 2020-09-29

## 2020-09-01 MED ORDER — ASPIRIN EC 81 MG PO TBEC: 81.0000 mg | DELAYED_RELEASE_TABLET | Freq: Every day | ORAL | 11 refills | Status: DC

## 2020-09-01 NOTE — Progress Notes (Signed)
ROB 17w  CC: None   Pt would like refill on PNV's. Gummies

## 2020-09-01 NOTE — Progress Notes (Signed)
   PRENATAL VISIT NOTE  Subjective:  Jacqueline Haas is a 35 y.o. [phone] at [redacted]w[redacted]d being seen today for ongoing prenatal care.  She is currently monitored for the following issues for this low-risk pregnancy and has History of cesarean section; Nausea and vomiting during pregnancy; Supervision of other normal pregnancy, antepartum; and Obesity affecting pregnancy in first trimester on their problem list.  Patient reports no complaints.  Contractions: Not present. Vag. Bleeding: None.  Movement: Present. Denies leaking of fluid.   The following portions of the patient's history were reviewed and updated as appropriate: allergies, current medications, past family history, past medical history, past social history, past surgical history and problem list.   Objective:   Vitals:   09/01/20 1617  BP: 134/81  Pulse: 90  Weight: 279 lb (126.6 kg)    Fetal Status: Fetal Heart Rate (bpm): 156   Movement: Present     General:  Alert, oriented and cooperative. Patient is in no acute distress.  Skin: Skin is warm and dry. No rash noted.   Cardiovascular: Normal heart rate noted  Respiratory: Normal respiratory effort, no problems with respiration noted  Abdomen: Soft, gravid, appropriate for gestational age.  Pain/Pressure: Absent     Pelvic: Cervical exam deferred        Extremities: Normal range of motion.  Edema: None  Mental Status: Normal mood and affect. Normal behavior. Normal judgment and thought content.   Assessment and Plan:  Pregnancy: [id number] at [redacted]w[redacted]d 1. Supervision of other normal pregnancy, antepartum  - AFP, Serum, Open Spina Bifida - Problem list reviewed, OB box started - Desires TOLAC, met with Dr. Elgie Congo at previous visit. Needs to sign consent with OB. - Nauseas has improved. - Reviewed BASA use, recommend starting it now. Rx sent   Preterm labor symptoms and general obstetric precautions including but not limited to vaginal bleeding, contractions, leaking of fluid  and fetal movement were reviewed in detail with the patient. Please refer to After Visit Summary for other counseling recommendations.   No follow-ups on file.  Future Appointments  Date Time Provider [place]  09/14/2020  9:00 AM [facility] US1 [facility]  09/29/2020  8:15 AM Gavin Pound, CNM [facility] None    Noni Saupe, NP

## 2020-09-03 LAB — AFP, SERUM, OPEN SPINA BIFIDA
AFP MoM: 1.06
AFP Value: 31.5 ng/mL
Gest. Age on Collection Date: 17 weeks
Maternal Age At EDD: 32.9 yr
OSBR Risk 1 IN: 10000
Test Results:: NEGATIVE
Weight: 279 [lb_av]

## 2020-09-14 ENCOUNTER — Other Ambulatory Visit: Payer: Self-pay

## 2020-09-14 ENCOUNTER — Other Ambulatory Visit: Payer: Self-pay | Admitting: Maternal & Fetal Medicine

## 2020-09-14 ENCOUNTER — Encounter: Payer: Self-pay | Admitting: *Deleted

## 2020-09-14 ENCOUNTER — Ambulatory Visit: Payer: No Typology Code available for payment source | Admitting: *Deleted

## 2020-09-14 ENCOUNTER — Ambulatory Visit: Payer: No Typology Code available for payment source | Attending: Obstetrics and Gynecology

## 2020-09-14 DIAGNOSIS — Z362 Encounter for other antenatal screening follow-up: Secondary | ICD-10-CM

## 2020-09-14 DIAGNOSIS — Z3480 Encounter for supervision of other normal pregnancy, unspecified trimester: Secondary | ICD-10-CM | POA: Insufficient documentation

## 2020-09-14 DIAGNOSIS — O99212 Obesity complicating pregnancy, second trimester: Secondary | ICD-10-CM

## 2020-09-19 ENCOUNTER — Inpatient Hospital Stay (HOSPITAL_COMMUNITY)
Admission: AD | Admit: 2020-09-19 | Discharge: 2020-09-19 | Disposition: A | Discharge status: Home / Self Care | Payer: No Typology Code available for payment source | Attending: Family Medicine | Admitting: Family Medicine

## 2020-09-19 ENCOUNTER — Encounter (HOSPITAL_COMMUNITY): Payer: Self-pay | Admitting: Family Medicine

## 2020-09-19 ENCOUNTER — Other Ambulatory Visit: Payer: Self-pay

## 2020-09-19 DIAGNOSIS — O26892 Other specified pregnancy related conditions, second trimester: Secondary | ICD-10-CM | POA: Insufficient documentation

## 2020-09-19 DIAGNOSIS — Z3A19 19 weeks gestation of pregnancy: Secondary | ICD-10-CM

## 2020-09-19 DIAGNOSIS — O99612 Diseases of the digestive system complicating pregnancy, second trimester: Secondary | ICD-10-CM | POA: Diagnosis not present

## 2020-09-19 DIAGNOSIS — R55 Syncope and collapse: Secondary | ICD-10-CM

## 2020-09-19 DIAGNOSIS — K219 Gastro-esophageal reflux disease without esophagitis: Secondary | ICD-10-CM | POA: Insufficient documentation

## 2020-09-19 DIAGNOSIS — O219 Vomiting of pregnancy, unspecified: Secondary | ICD-10-CM | POA: Diagnosis not present

## 2020-09-19 DIAGNOSIS — R42 Dizziness and giddiness: Secondary | ICD-10-CM | POA: Diagnosis not present

## 2020-09-19 DIAGNOSIS — O210 Mild hyperemesis gravidarum: Secondary | ICD-10-CM | POA: Diagnosis not present

## 2020-09-19 DIAGNOSIS — Z87891 Personal history of nicotine dependence: Secondary | ICD-10-CM | POA: Diagnosis not present

## 2020-09-19 LAB — URINALYSIS, ROUTINE W REFLEX MICROSCOPIC
Bilirubin Urine: NEGATIVE
Glucose, UA: NEGATIVE mg/dL
Hgb urine dipstick: NEGATIVE
Ketones, ur: NEGATIVE mg/dL
Leukocytes,Ua: NEGATIVE
Nitrite: NEGATIVE
Protein, ur: NEGATIVE mg/dL
Specific Gravity, Urine: 1.015 (ref 1.005–1.030)
pH: 6 (ref 5.0–8.0)

## 2020-09-19 LAB — COMPREHENSIVE METABOLIC PANEL
ALT: 16 U/L (ref 0–44)
AST: 19 U/L (ref 15–41)
Albumin: 3.1 g/dL — ABNORMAL LOW (ref 3.5–5.0)
Alkaline Phosphatase: 51 U/L (ref 38–126)
Anion gap: 13 (ref 5–15)
BUN: <5 mg/dL — ABNORMAL LOW (ref 6–20)
CO2: 18 mmol/L — ABNORMAL LOW (ref 22–32)
Calcium: 9.7 mg/dL (ref 8.9–10.3)
Chloride: 101 mmol/L (ref 98–111)
Creatinine, Ser: 0.62 mg/dL (ref 0.44–1.00)
GFR, Estimated: >60 mL/min (ref >60)
Glucose, Bld: 86 mg/dL (ref 70–99)
Potassium: 3.7 mmol/L (ref 3.5–5.1)
Sodium: 132 mmol/L — ABNORMAL LOW (ref 135–145)
Total Bilirubin: 0.6 mg/dL (ref 0.3–1.2)
Total Protein: 7.4 g/dL (ref 6.5–8.1)

## 2020-09-19 LAB — GLUCOSE, CAPILLARY: Glucose-Capillary: 93 mg/dL (ref 70–99)

## 2020-09-19 MED ORDER — ONDANSETRON 4 MG PO TBDP
8.0000 mg | ORAL_TABLET | Freq: Once | ORAL | Status: AC
  Administered 2020-09-19: 8 mg via ORAL
  Filled 2020-09-19: qty 2

## 2020-09-19 MED ORDER — FAMOTIDINE 20 MG PO TABS: 20.0000 mg | ORAL_TABLET | Freq: Two times a day (BID) | ORAL | 1 refills | Status: DC

## 2020-09-19 NOTE — Discharge Instructions (Signed)
Syncope Syncope is when you pass out (faint) for a short time. It is caused by a sudden decrease in blood flow to the brain. Signs that you may be about to pass out include:  Feeling dizzy or light-headed.  Feeling sick to your stomach (nauseous).  Seeing all white or all black.  Having cold, clammy skin. If you pass out, get help right away. Call your local emergency services (911 in the [place] Do not drive yourself to the hospital. Follow these instructions at home: Watch for any changes in your symptoms. Take these actions to stay safe and help with your symptoms: Lifestyle  Do not drive, use machinery, or play sports until your doctor says it is okay.  Do not drink alcohol.  Do not use any products that contain nicotine or tobacco, such as cigarettes and e-cigarettes. If you need help quitting, ask your doctor.  Drink enough fluid to keep your pee (urine) pale yellow. General instructions  Take over-the-counter and prescription medicines only as told by your doctor.  If you are taking blood pressure or heart medicine, sit up and stand up slowly. Spend a few minutes getting ready to sit and then stand. This can help you feel less dizzy.  Have someone stay with you until you feel stable.  If you start to feel like you might pass out, lie down right away and raise (elevate) your feet above the level of your heart. Breathe deeply and steadily. Wait until all of the symptoms are gone.  Keep all follow-up visits as told by your doctor. This is important. Get help right away if:  You have a very bad headache.  You pass out once or more than once.  You have pain in your chest, belly, or back.  You have a very fast or uneven heartbeat (palpitations).  It hurts to breathe.  You are bleeding from your mouth or your bottom (rectum).  You have black or tarry poop (stool).  You have jerky movements that you cannot control (seizure).  You are confused.  You have trouble  walking.  You are very weak.  You have vision problems. These symptoms may be an emergency. Do not wait to see if the symptoms will go away. Get medical help right away. Call your local emergency services (911 in the [place] Do not drive yourself to the hospital. Summary  Syncope is when you pass out (faint) for a short time. It is caused by a sudden decrease in blood flow to the brain.  Signs that you may be about to faint include feeling dizzy, light-headed, or sick to your stomach, seeing all white or all black, or having cold, clammy skin.  If you start to feel like you might pass out, lie down right away and raise (elevate) your feet above the level of your heart. Breathe deeply and steadily. Wait until all of the symptoms are gone. This information is not intended to replace advice given to you by your health care provider. Make sure you discuss any questions you have with your health care provider. Document Revised: 08/12/2019 Document Reviewed: 08/14/2017 Elsevier Patient Education  2021 Elsevier Inc.  

## 2020-09-19 NOTE — MAU Provider Note (Signed)
History     CSN: [id number]  Arrival date and time: 09/19/20 1316   Event Date/Time   First Provider Initiated Contact with Patient 09/19/20 1357      No chief complaint on file.  HPI  Jacqueline Haas is a 35 y.o. female [phone] @ [redacted]w[redacted]d here after a syncopal episode that occurred unwitnessed at home. At 12:15 today, she was sitting at her desk and became dizzy. Her desk is over a vent and she had just turned her heat up to 75 degrees after feeling chilled.  She got up to turn the heat down and felt very dizzy. She went back to her desk and then woke up on the floor. She landed face down and reports feeling like her chin was sore. She reports no abrasions.  She did land on her abdomen. She does not have any pain in her abdomen, no bleeding. No headache. Reports having decrease appetite lately 2/2 to GERD. Just not able to eat the same foods she has in the past.   Diet recall:  She ate a bagel 1/2 of bagel right before the syncopal episode. Around 0800 she ate 1/2 of a granola bar. She has had 1 bottle of water today. No vomiting in the last 24 hours, just + nausea.   OB History    Gravida  4   Para  2   Term  2   Preterm  0   AB  1   Living  2     SAB  0   IAB  1   Ectopic  0   Multiple      Live Births  2           Past Medical History:  Diagnosis Date  . UTI (urinary tract infection)     Past Surgical History:  Procedure Laterality Date  . CESAREAN SECTION    . TONSILLECTOMY      No family history on file.  Social History   Tobacco Use  . Smoking status: Former Smoker    Types: E-cigarettes    Quit date: 01/30/2015    Years since quitting: 5.6  . Smokeless tobacco: Never Used  . Tobacco comment: Hookah  Substance Use Topics  . Alcohol use: Not Currently    Comment: last adult beverage 22 june 20  . Drug use: No    Allergies:  Allergies  Allergen Reactions  . Morphine And Related Hives  . Morphine And Related     hives     Facility-Administered Medications Prior to Admission  Medication Dose Route Frequency Provider Last Rate Last Admin  . etonogestrel (NEXPLANON) implant 68 mg  68 mg Subdermal Once Brock Bad, MD       Medications Prior to Admission  Medication Sig Dispense Refill Last Dose  . aspirin EC 81 MG tablet Take 1 tablet (81 mg total) by mouth daily. Swallow whole. 30 tablet 11 09/19/2020 at Unknown time  . Prenatal Vit-Fe Phos-FA-Omega (VITAFOL GUMMIES) 3.33-0.333-34.8 MG CHEW Chew 1 tablet by mouth daily. 90 tablet 12 09/19/2020 at Unknown time  . Ascorbic Acid (VITAMIN C) 1000 MG tablet Take 1,000 mg by mouth daily.   More than a month at Unknown time  . Biotin 10 MG TABS Take by mouth.   More than a month at Unknown time  . clindamycin (CLEOCIN-T) 1 % lotion Apply topically 2 (two) times daily. (Patient not taking: No sig reported) 60 mL 4   . clotrimazole (LOTRIMIN) 1 %  cream Apply 1 application topically 2 (two) times daily. (Patient not taking: No sig reported) 113 g 1   . Doxylamine-Pyridoxine (DICLEGIS) 10-10 MG TBEC Take 2 tablets by mouth at bedtime. If symptoms persist, add one tablet in the morning and one in the afternoon (Patient not taking: Reported on 09/01/2020) 100 tablet 5   . escitalopram (LEXAPRO) 10 MG tablet TAKE 1 TABLET BY MOUTH EVERY DAY (Patient not taking: Reported on 08/03/2020) 90 tablet 2    Results for orders placed or performed during the hospital encounter of 09/19/20 (from the past 48 hour(s))  Glucose, capillary     Status: None   Collection Time: 09/19/20  2:01 PM  Result Value Ref Range   Glucose-Capillary 93 70 - 99 mg/dL    Comment: Glucose reference range applies only to samples taken after fasting for at least 8 hours.  Urinalysis, Routine w reflex microscopic Urine, Clean Catch     Status: None   Collection Time: 09/19/20  3:14 PM  Result Value Ref Range   Color, Urine YELLOW YELLOW   APPearance CLEAR CLEAR   Specific Gravity, Urine 1.015 1.005 -  1.030   pH 6.0 5.0 - 8.0   Glucose, UA NEGATIVE NEGATIVE mg/dL   Hgb urine dipstick NEGATIVE NEGATIVE   Bilirubin Urine NEGATIVE NEGATIVE   Ketones, ur NEGATIVE NEGATIVE mg/dL   Protein, ur NEGATIVE NEGATIVE mg/dL   Nitrite NEGATIVE NEGATIVE   Leukocytes,Ua NEGATIVE NEGATIVE    Comment: Performed at [facility], [address], [place], [place] [address]  Comprehensive metabolic panel     Status: Abnormal   Collection Time: 09/19/20  4:29 PM  Result Value Ref Range   Sodium 132 (L) 135 - 145 mmol/L   Potassium 3.7 3.5 - 5.1 mmol/L   Chloride 101 98 - 111 mmol/L   CO2 18 (L) 22 - 32 mmol/L   Glucose, Bld 86 70 - 99 mg/dL    Comment: Glucose reference range applies only to samples taken after fasting for at least 8 hours.   BUN <5 (L) 6 - 20 mg/dL   Creatinine, Ser 2.77 0.44 - 1.00 mg/dL   Calcium 9.7 8.9 - 82.4 mg/dL   Total Protein 7.4 6.5 - 8.1 g/dL   Albumin 3.1 (L) 3.5 - 5.0 g/dL   AST 19 15 - 41 U/L   ALT 16 0 - 44 U/L   Alkaline Phosphatase 51 38 - 126 U/L   Total Bilirubin 0.6 0.3 - 1.2 mg/dL   GFR, Estimated >23 >53 mL/min    Comment: (NOTE) Calculated using the CKD-EPI Creatinine Equation (2021)    Anion gap 13 5 - 15    Comment: Performed at [facility], [address], [place], [place] [address]   Review of Systems  Constitutional: Positive for appetite change. Negative for fever.  Gastrointestinal: Negative for abdominal pain.  Genitourinary: Negative for vaginal bleeding.  Neurological: Negative for dizziness.   Physical Exam   Blood pressure 128/68, pulse 89, temperature 98.4 F (36.9 C), temperature source Oral, resp. rate 20, height 5\' 7"  (1.702 m), weight 123.2 kg, last menstrual period 05/04/2020, SpO2 100 %, unknown if currently breastfeeding.  Physical Exam Vitals and nursing note reviewed.  Constitutional:      General: She is not in acute distress.    Appearance: Normal appearance. She is obese. She is not ill-appearing,  toxic-appearing or diaphoretic.  HENT:     Head: Normocephalic.  Eyes:     Pupils:  Pupils are equal, round, and reactive to light.  Cardiovascular:     Rate and Rhythm: Normal rate and regular rhythm.  Pulmonary:     Effort: Pulmonary effort is normal.     Breath sounds: Normal breath sounds.  Skin:    General: Skin is warm.  Neurological:     Mental Status: She is alert and oriented to person, place, and time.  Psychiatric:        Behavior: Behavior normal.    MAU Course  Procedures  None  MDM  + fetal heart tones dia doppler  CBG: 90 Initial orthostatic vitals + Patient was given the option for oral hydration or IV fluid bolus. She was able to drink 1 pitcher of water.  Repeat orthostatic vitals shows improvement CMP pending- collected for baseline Patient vomited prior to DC home, Zofran given 8 mg ODT and patient reports feeling much improvement EKG: Normal sinus rhythm, read by Dr. Adrian Blackwater Reviewed patient with Dr. Micael Hampshire.   Assessment and Plan   A:  1. Nausea and vomiting during pregnancy   2. Dizziness   3. Syncope, unspecified syncope type   4. [redacted] weeks gestation of pregnancy     P:  Discharge home in stable condition  Rx: Pepcid BID  Increase oral fluid intake at home 8-10 bottles of water daily Change positions slowly.  Small, frequent snacks with protein Return to MAU if symptoms worsen   Mikkel Charrette, Harolyn Rutherford, NP 09/19/2020 8:43 PM

## 2020-09-19 NOTE — MAU Note (Signed)
Orhtostatic b/p's  Improved. Pt stated she was hungry gave her some saltines and peanut butter but she vomited them back up . notified provider. zofran ordered.

## 2020-09-19 NOTE — MAU Note (Signed)
Presents with c/o passed while @ work.  States began feeling dizzy while talking on the phone, awoke to laying on the floor.  Estimates had passed out for approximately 2 minutes.  Reports when woke up, she was lying on her stomach.  Unsure if she struck anything, but chin is sore. Denies VB or LOF.

## 2020-09-29 ENCOUNTER — Ambulatory Visit (INDEPENDENT_AMBULATORY_CARE_PROVIDER_SITE_OTHER): Payer: No Typology Code available for payment source

## 2020-09-29 ENCOUNTER — Other Ambulatory Visit: Payer: Self-pay

## 2020-09-29 VITALS — BP 117/81 | HR 91 | Wt 281.0 lb

## 2020-09-29 DIAGNOSIS — Z3480 Encounter for supervision of other normal pregnancy, unspecified trimester: Secondary | ICD-10-CM

## 2020-09-29 DIAGNOSIS — O99211 Obesity complicating pregnancy, first trimester: Secondary | ICD-10-CM

## 2020-09-29 DIAGNOSIS — Z3A21 21 weeks gestation of pregnancy: Secondary | ICD-10-CM

## 2020-09-29 DIAGNOSIS — Z98891 History of uterine scar from previous surgery: Secondary | ICD-10-CM

## 2020-09-29 DIAGNOSIS — Z8659 Personal history of other mental and behavioral disorders: Secondary | ICD-10-CM

## 2020-09-29 MED ORDER — VITAFOL GUMMIES 3.33-0.333-34.8 MG PO CHEW: 1.0000 | CHEWABLE_TABLET | Freq: Every day | ORAL | 12 refills | Status: AC

## 2020-09-29 NOTE — Patient Instructions (Signed)
Oral Glucose Tolerance Test During Pregnancy Why am I having this test? The oral glucose tolerance test (OGTT) is done to check how your body processes blood sugar (glucose). This is one of several tests used to diagnose diabetes that develops during pregnancy (gestational diabetes mellitus). Gestational diabetes is a short-term form of diabetes that some women develop while they are pregnant. It usually occurs during the second trimester of pregnancy and goes away after delivery. Testing, or screening, for gestational diabetes usually occurs at weeks 24-28 of pregnancy. You may have the OGTT test after having a 1-hour glucose screening test if the results from that test indicate that you may have gestational diabetes. This test may also be needed if:  You have a history of gestational diabetes.  There is a history of giving birth to very large babies or of losing pregnancies (having stillbirths).  You have signs and symptoms of diabetes, such as: ? Changes in your eyesight. ? Tingling or numbness in your hands or feet. ? Changes in hunger, thirst, and urination, and these are not explained by your pregnancy. What is being tested? This test measures the amount of glucose in your blood at different times during a period of 3 hours. This shows how well your body can process glucose. What kind of sample is taken? Blood samples are required for this test. They are usually collected by inserting a needle into a blood vessel.   How do I prepare for this test?  For 3 days before your test, eat normally. Have plenty of carbohydrate-rich foods.  Follow instructions from your health care provider about: ? Eating or drinking restrictions on the day of the test. You may be asked not to eat or drink anything other than water (to fast) starting 8-10 hours before the test. ? Changing or stopping your regular medicines. Some medicines may interfere with this test. Tell a health care provider about:  All  medicines you are taking, including vitamins, herbs, eye drops, creams, and over-the-counter medicines.  Any blood disorders you have.  Any surgeries you have had.  Any medical conditions you have. What happens during the test? First, your blood glucose will be measured. This is referred to as your fasting blood glucose because you fasted before the test. Then, you will drink a glucose solution that contains a certain amount of glucose. Your blood glucose will be measured again 1, 2, and 3 hours after you drink the solution. This test takes about 3 hours to complete. You will need to stay at the testing location during this time. During the testing period:  Do not eat or drink anything other than the glucose solution.  Do not exercise.  Do not use any products that contain nicotine or tobacco, such as cigarettes, e-cigarettes, and chewing tobacco. These can affect your test results. If you need help quitting, ask your health care provider. The testing procedure may vary among health care providers and hospitals. How are the results reported? Your results will be reported as milligrams of glucose per deciliter of blood (mg/dL) or millimoles per liter (mmol/L). There is more than one source for screening and diagnosis reference values used to diagnose gestational diabetes. Your health care provider will compare your results to normal values that were established after testing a large group of people (reference values). Reference values may vary among labs and hospitals. For this test (Carpenter-Coustan), reference values are:  Fasting: 95 mg/dL (5.3 mmol/L).  1 hour: 180 mg/dL (10.0 mmol/L).  2 hour:   155 mg/dL (8.6 mmol/L).  3 hour: 140 mg/dL (7.8 mmol/L). What do the results mean? Results below the reference values are considered normal. If two or more of your blood glucose levels are at or above the reference values, you may be diagnosed with gestational diabetes. If only one level is  high, your health care provider may suggest repeat testing or other tests to confirm a diagnosis. Talk with your health care provider about what your results mean. Questions to ask your health care provider Ask your health care provider, or the department that is doing the test:  When will my results be ready?  How will I get my results?  What are my treatment options?  What other tests do I need?  What are my next steps? Summary  The oral glucose tolerance test (OGTT) is one of several tests used to diagnose diabetes that develops during pregnancy (gestational diabetes mellitus). Gestational diabetes is a short-term form of diabetes that some women develop while they are pregnant.  You may have the OGTT test after having a 1-hour glucose screening test if the results from that test show that you may have gestational diabetes. You may also have this test if you have any symptoms or risk factors for this type of diabetes.  Talk with your health care provider about what your results mean. This information is not intended to replace advice given to you by your health care provider. Make sure you discuss any questions you have with your health care provider. Document Revised: 12/10/2019 Document Reviewed: 12/10/2019 Elsevier Patient Education  2021 Elsevier Inc.  

## 2020-09-29 NOTE — Progress Notes (Signed)
LOW-RISK PREGNANCY OFFICE VISIT  Patient name: Jacqueline Haas MRN [id number]  Date of birth: [birth year] Chief Complaint:   Routine Prenatal Visit  Subjective:   Jacqueline Haas is a 35 y.o. [id number] female at 19w1dwith an Estimated Date of Delivery: 02/08/21 being seen today for ongoing management of a low-risk pregnancy aeb has History of cesarean section; Nausea and vomiting during pregnancy; Supervision of other normal pregnancy, antepartum; and Obesity affecting pregnancy in first trimester on their problem list.  Patient presents today without complaint.  Patient endorses fetal movement.  Patient denies vaginal concerns including abnormal discharge, leaking of fluid, and bleeding. She also denies abdominal cramping or contractions. Contractions: Not present. Vag. Bleeding: None.  Movement: Present.  Patient expresses concern and fears with having repeat c/s as she strongly desires TOLAC.  Patient also feels it would be beneficial to have a doula as her husband currently lives out of state and may be delayed or even unable to attend delivery.  However, she states that she was told she does not "qualify d/t income."  Patient reports a history of depression and states she had a recent syncope episode after mistakenly taking her discontinued antidepressant.  [facility]   Reviewed past medical,surgical, social, obstetrical and family history as well as problem list, medications and allergies.  Objective   Vitals:   09/29/20 0824  Weight: 281 lb (127.5 kg)  Body mass index is 44.01 kg/m.  Total Weight Gain:29 lb (13.2 kg)         Physical Examination:   General appearance: Well appearing, and in no distress  Mental status: Alert, oriented to person, place, and time  Skin: Warm & dry  Cardiovascular: Normal heart rate noted  Respiratory: Normal respiratory effort, no distress  Abdomen: Soft, gravid, nontender, AGA with Fundal Height:  23 cm by manual measurement  Pelvic: Cervical exam deferred           Extremities: Edema: None  Fetal Status: Fetal Heart Rate (bpm): 158  Movement: Present   No results found for this or any previous visit (from the past 24 hour(s)).  Assessment & Plan:  Low-risk pregnancy of a 35y.o., [id number] 259w1dith an Estimated Date of Delivery: 02/08/21   1. Supervision of other normal pregnancy, antepartum -Anticipatory guidance for upcoming appts. -Patient to next appt in 5 weeks for an in-person visit. -Reviewed Glucola appt preparation including fasting the night before and morning of.   -Discussed anticipated office time of 2.5-3 hours.  -Reviewed blood draw procedures and labs which also include check of iron level.  -Discussed how results of GTT are handled including diabetic education and BS testing for abnormal results and routine care for normal results.   2. Obesity affecting pregnancy in first trimester -Taking baby aspirin as prescribed. -Provided brief education regarding PreEclampsia including risks.  3. [redacted] weeks gestation of pregnancy -Doing well overall. -Reviewed questions and concerns.  -Rx for PNV sent per patient request.   4. History of cesarean section -Desires TOLAC and has met with MD. -Needs consent -Reports last C/S was 8 years ago and d/t fetal intolerance. -Reassured that goal is for TOLAC, but provider can not assure that VB will occur as pregnancy and labor is unpredictable.  -Discussed option of doula support and will send message to doula services on patient behalf.   5. History of depression -Cautioned regarding risks of depression onset in pregnancy and postpartum period d/t  history. -Discussed availability of IBH services. -Encouraged to meet with LCSW to establish care and familiarize self with resources available. -Patient agreeable to referral, which was ordered.    Meds:  Meds ordered this encounter  Medications  . Prenatal Vit-Fe  Phos-FA-Omega (VITAFOL GUMMIES) 3.33-0.333-34.8 MG CHEW    Sig: Chew 1 tablet by mouth daily.    Dispense:  90 tablet    Refill:  12   Labs/procedures today:  Lab Orders  No laboratory test(s) ordered today     Reviewed: Preterm labor symptoms and general obstetric precautions including but not limited to vaginal bleeding, contractions, leaking of fluid and fetal movement were reviewed in detail with the patient.  All questions were answered.  Follow-up: Return in about 5 weeks (around 11/03/2020) for [place] with GTT.    No orders of the defined types were placed in this encounter.  Maryann Conners MSN, CNM 09/29/2020

## 2020-09-29 NOTE — Progress Notes (Signed)
ROB [redacted]w[redacted]d  Pt went to pick up PNV's at pharmacy pt states they were not at pharmacy.  PNV's sent today.   CC: None   *Will need to get pt B/P before leaving Cuff was not working.

## 2020-10-11 ENCOUNTER — Institutional Professional Consult (permissible substitution): Payer: No Typology Code available for payment source | Admitting: Licensed Clinical Social Worker

## 2020-10-11 ENCOUNTER — Telehealth: Payer: Self-pay | Admitting: Licensed Clinical Social Worker

## 2020-10-11 NOTE — Telephone Encounter (Signed)
Called pt regarding scheduled appt. Left message

## 2020-10-20 ENCOUNTER — Ambulatory Visit: Payer: No Typology Code available for payment source | Attending: Maternal & Fetal Medicine

## 2020-10-20 ENCOUNTER — Other Ambulatory Visit: Payer: Self-pay | Admitting: *Deleted

## 2020-10-20 ENCOUNTER — Encounter: Payer: Self-pay | Admitting: *Deleted

## 2020-10-20 ENCOUNTER — Other Ambulatory Visit: Payer: Self-pay

## 2020-10-20 ENCOUNTER — Ambulatory Visit: Payer: No Typology Code available for payment source | Admitting: *Deleted

## 2020-10-20 VITALS — BP 129/74 | HR 105

## 2020-10-20 DIAGNOSIS — Z6839 Body mass index (BMI) 39.0-39.9, adult: Secondary | ICD-10-CM

## 2020-10-20 DIAGNOSIS — O99212 Obesity complicating pregnancy, second trimester: Secondary | ICD-10-CM | POA: Insufficient documentation

## 2020-10-20 DIAGNOSIS — Z362 Encounter for other antenatal screening follow-up: Secondary | ICD-10-CM | POA: Diagnosis present

## 2020-11-02 ENCOUNTER — Other Ambulatory Visit (HOSPITAL_COMMUNITY)
Admission: RE | Admit: 2020-11-02 | Discharge: 2020-11-02 | Disposition: A | Discharge status: Home / Self Care | Payer: No Typology Code available for payment source | Source: Ambulatory Visit

## 2020-11-02 ENCOUNTER — Other Ambulatory Visit: Payer: Self-pay

## 2020-11-02 ENCOUNTER — Other Ambulatory Visit: Payer: No Typology Code available for payment source

## 2020-11-02 ENCOUNTER — Ambulatory Visit (INDEPENDENT_AMBULATORY_CARE_PROVIDER_SITE_OTHER): Payer: No Typology Code available for payment source

## 2020-11-02 VITALS — BP 121/75 | HR 101 | Wt 285.0 lb

## 2020-11-02 DIAGNOSIS — N898 Other specified noninflammatory disorders of vagina: Secondary | ICD-10-CM | POA: Insufficient documentation

## 2020-11-02 DIAGNOSIS — Z3480 Encounter for supervision of other normal pregnancy, unspecified trimester: Secondary | ICD-10-CM

## 2020-11-02 DIAGNOSIS — Z3A26 26 weeks gestation of pregnancy: Secondary | ICD-10-CM

## 2020-11-02 NOTE — Progress Notes (Signed)
ROB 26w  GTT will need to be R/S pt ate pretzels after midnight.   CC: Carpal tunnel and possible yeast infection. Pt wants to do self swab for BV and yeast only.

## 2020-11-02 NOTE — Patient Instructions (Signed)
Oral Glucose Tolerance Test During Pregnancy Why am I having this test? The oral glucose tolerance test (OGTT) is done to check how your body processes blood sugar (glucose). This is one of several tests used to diagnose diabetes that develops during pregnancy (gestational diabetes mellitus). Gestational diabetes is a short-term form of diabetes that some women develop while they are pregnant. It usually occurs during the second trimester of pregnancy and goes away after delivery. Testing, or screening, for gestational diabetes usually occurs at weeks 24-28 of pregnancy. You may have the OGTT test after having a 1-hour glucose screening test if the results from that test indicate that you may have gestational diabetes. This test may also be needed if:  You have a history of gestational diabetes.  There is a history of giving birth to very large babies or of losing pregnancies (having stillbirths).  You have signs and symptoms of diabetes, such as: ? Changes in your eyesight. ? Tingling or numbness in your hands or feet. ? Changes in hunger, thirst, and urination, and these are not explained by your pregnancy. What is being tested? This test measures the amount of glucose in your blood at different times during a period of 3 hours. This shows how well your body can process glucose. What kind of sample is taken? Blood samples are required for this test. They are usually collected by inserting a needle into a blood vessel.   How do I prepare for this test?  For 3 days before your test, eat normally. Have plenty of carbohydrate-rich foods.  Follow instructions from your health care provider about: ? Eating or drinking restrictions on the day of the test. You may be asked not to eat or drink anything other than water (to fast) starting 8-10 hours before the test. ? Changing or stopping your regular medicines. Some medicines may interfere with this test. Tell a health care provider about:  All  medicines you are taking, including vitamins, herbs, eye drops, creams, and over-the-counter medicines.  Any blood disorders you have.  Any surgeries you have had.  Any medical conditions you have. What happens during the test? First, your blood glucose will be measured. This is referred to as your fasting blood glucose because you fasted before the test. Then, you will drink a glucose solution that contains a certain amount of glucose. Your blood glucose will be measured again 1, 2, and 3 hours after you drink the solution. This test takes about 3 hours to complete. You will need to stay at the testing location during this time. During the testing period:  Do not eat or drink anything other than the glucose solution.  Do not exercise.  Do not use any products that contain nicotine or tobacco, such as cigarettes, e-cigarettes, and chewing tobacco. These can affect your test results. If you need help quitting, ask your health care provider. The testing procedure may vary among health care providers and hospitals. How are the results reported? Your results will be reported as milligrams of glucose per deciliter of blood (mg/dL) or millimoles per liter (mmol/L). There is more than one source for screening and diagnosis reference values used to diagnose gestational diabetes. Your health care provider will compare your results to normal values that were established after testing a large group of people (reference values). Reference values may vary among labs and hospitals. For this test (Carpenter-Coustan), reference values are:  Fasting: 95 mg/dL (5.3 mmol/L).  1 hour: 180 mg/dL (10.0 mmol/L).  2 hour:   155 mg/dL (8.6 mmol/L).  3 hour: 140 mg/dL (7.8 mmol/L). What do the results mean? Results below the reference values are considered normal. If two or more of your blood glucose levels are at or above the reference values, you may be diagnosed with gestational diabetes. If only one level is  high, your health care provider may suggest repeat testing or other tests to confirm a diagnosis. Talk with your health care provider about what your results mean. Questions to ask your health care provider Ask your health care provider, or the department that is doing the test:  When will my results be ready?  How will I get my results?  What are my treatment options?  What other tests do I need?  What are my next steps? Summary  The oral glucose tolerance test (OGTT) is one of several tests used to diagnose diabetes that develops during pregnancy (gestational diabetes mellitus). Gestational diabetes is a short-term form of diabetes that some women develop while they are pregnant.  You may have the OGTT test after having a 1-hour glucose screening test if the results from that test show that you may have gestational diabetes. You may also have this test if you have any symptoms or risk factors for this type of diabetes.  Talk with your health care provider about what your results mean. This information is not intended to replace advice given to you by your health care provider. Make sure you discuss any questions you have with your health care provider. Document Revised: 12/10/2019 Document Reviewed: 12/10/2019 Elsevier Patient Education  2021 Elsevier Inc.  

## 2020-11-02 NOTE — Progress Notes (Signed)
LOW-RISK PREGNANCY OFFICE VISIT  Patient name: Jacqueline Haas MRN [id number]  Date of birth: March 25, 1986 Chief Complaint:   Routine Prenatal Visit  Subjective:   Jacqueline Haas is a 35 y.o. [id number] femalle at [redacted]w[redacted]d with an Estimated Date of Delivery: 02/08/21 being seen today for ongoing management of a low-risk pregnancy aeb has History of cesarean section; Nausea and vomiting during pregnancy; Supervision of other normal pregnancy, antepartum; and Obesity affecting pregnancy in first trimester on their problem list.  Patient presents today with complaint of carpal tunnel symptoms.  She states she has obtained some hand braces and her symptoms have improved.  Patient endorses fetal movement. Patient denies contractions or abdominal cramping. Patient reports some vaginal irritation and a change in vaginal discharge.  She reports it is a yeast infection or BV and her symptoms have been present for about a week.  She states she has been soaking and using bath bombs and feels it is the cause.  Patient denies other vaginal concerns including leaking of fluid or bleeding.  Contractions: Not present. Vag. Bleeding: None.  Movement: Present.  Reviewed past medical,surgical, social, obstetrical and family history as well as problem list, medications and allergies.  Objective   Vitals:   11/02/20 0829  Weight: 285 lb (129.3 kg)  Body mass index is 44.64 kg/m.  Total Weight Gain:33 lb (15 kg)         Physical Examination:   General appearance: Well appearing, and in no distress  Mental status: Alert, oriented to person, place, and time  Skin: Warm & dry  Cardiovascular: Normal heart rate noted  Respiratory: Normal respiratory effort, no distress  Abdomen: Soft, gravid, nontender, AGA with Fundal Height: 29 cm  Pelvic: Cervical exam deferred           Extremities: Edema: None  Fetal Status: Fetal Heart Rate (bpm): 150  Movement: Present   No results found for this or any previous  visit (from the past 24 hour(s)).  Assessment & Plan:  Low-risk pregnancy of a 35 y.o., [id number] at [redacted]w[redacted]d with an Estimated Date of Delivery: 02/08/21   1. Supervision of other normal pregnancy, antepartum -Anticipatory guidance for upcoming appts. -Patient to next appt in 4 weeks for an in-person visit. -Patient will need to return in 1-2 weeks for GTT. -Reviewed Glucola appt preparation including fasting the night before and morning of.   -Discussed anticipated office time of 2.5-3 hours.  -Reviewed blood draw procedures and labs which also include check of iron, HIV, RPR level.  -Discussed how results of GTT are handled including diabetic education and BS testing for abnormal results and routine care for normal results.   2. Vaginal discharge -Self swab collected -Informed that we will treat based on findings.  3. [redacted] weeks gestation of pregnancy -Doing well overall. -Discussed returning for GTT in 1-2 weeks.  -Patient states she is traveling to [place] tomorrow to elope! -Instructed to wear compression stockings and ambulate at least once an hour while flying.     Meds: No orders of the defined types were placed in this encounter.  Labs/procedures today:  Lab Orders  No laboratory test(s) ordered today     Reviewed: Preterm labor symptoms and general obstetric precautions including but not limited to vaginal bleeding, contractions, leaking of fluid and fetal movement were reviewed in detail with the patient.  All questions were answered.  Follow-up: Return in about 4 weeks (around 11/30/2020) for LROB-Needs MD appt to sign TOLAC.  No orders  of the defined types were placed in this encounter.  Cherre Robins MSN, CNM 11/02/2020

## 2020-11-03 LAB — CERVICOVAGINAL ANCILLARY ONLY
Bacterial Vaginitis (gardnerella): POSITIVE — AB
Candida Glabrata: NEGATIVE
Candida Vaginitis: POSITIVE — AB
Comment: NEGATIVE
Comment: NEGATIVE
Comment: NEGATIVE

## 2020-11-07 ENCOUNTER — Other Ambulatory Visit: Payer: Self-pay

## 2020-11-07 NOTE — Telephone Encounter (Signed)
Patient tested positive for bv and yeast. Will treat per proctcol

## 2020-11-08 ENCOUNTER — Other Ambulatory Visit: Payer: Self-pay

## 2020-11-08 MED ORDER — METRONIDAZOLE 500 MG PO TABS: 500.0000 mg | ORAL_TABLET | Freq: Two times a day (BID) | ORAL | 0 refills | Status: DC

## 2020-11-08 MED ORDER — TERCONAZOLE 0.4 % VA CREA: 1.0000 | TOPICAL_CREAM | Freq: Every day | VAGINAL | 0 refills | Status: DC

## 2020-11-08 NOTE — Telephone Encounter (Signed)
Patient tested positive for yeast and bv. Will treat per protocol.  

## 2020-11-10 ENCOUNTER — Other Ambulatory Visit: Payer: Self-pay

## 2020-11-10 ENCOUNTER — Other Ambulatory Visit: Payer: No Typology Code available for payment source

## 2020-11-10 DIAGNOSIS — Z3480 Encounter for supervision of other normal pregnancy, unspecified trimester: Secondary | ICD-10-CM

## 2020-11-11 LAB — GLUCOSE TOLERANCE, 2 HOURS W/ 1HR
Glucose, 1 hour: 119 mg/dL (ref 65–179)
Glucose, 2 hour: 73 mg/dL (ref 65–152)
Glucose, Fasting: 82 mg/dL (ref 65–91)

## 2020-11-11 LAB — CBC
Hematocrit: 32.1 % — ABNORMAL LOW (ref 34.0–46.6)
Hemoglobin: 10.4 g/dL — ABNORMAL LOW (ref 11.1–15.9)
MCH: 27 pg (ref 26.6–33.0)
MCHC: 32.4 g/dL (ref 31.5–35.7)
MCV: 83 fL (ref 79–97)
Platelets: 360 10*3/uL (ref 150–450)
RBC: 3.85 x10E6/uL (ref 3.77–5.28)
RDW: 14.5 % (ref 11.7–15.4)
WBC: 11.6 10*3/uL — ABNORMAL HIGH (ref 3.4–10.8)

## 2020-11-11 LAB — RPR: RPR Ser Ql: NONREACTIVE

## 2020-11-11 LAB — HIV ANTIBODY (ROUTINE TESTING W REFLEX): HIV Screen 4th Generation wRfx: NONREACTIVE

## 2020-12-01 ENCOUNTER — Other Ambulatory Visit: Payer: Self-pay

## 2020-12-01 ENCOUNTER — Ambulatory Visit (INDEPENDENT_AMBULATORY_CARE_PROVIDER_SITE_OTHER): Payer: No Typology Code available for payment source | Admitting: Obstetrics and Gynecology

## 2020-12-01 ENCOUNTER — Encounter: Payer: Self-pay | Admitting: Obstetrics and Gynecology

## 2020-12-01 VITALS — BP 128/83 | HR 98 | Wt 281.0 lb

## 2020-12-01 DIAGNOSIS — Z98891 History of uterine scar from previous surgery: Secondary | ICD-10-CM

## 2020-12-01 DIAGNOSIS — Z3480 Encounter for supervision of other normal pregnancy, unspecified trimester: Secondary | ICD-10-CM

## 2020-12-01 NOTE — Progress Notes (Signed)
Subjective:  Jacqueline Haas is a 35 y.o. [phone] at [redacted]w[redacted]d being seen today for ongoing prenatal care.  She is currently monitored for the following issues for this low-risk pregnancy and has History of cesarean section; Supervision of other normal pregnancy, antepartum; and Obesity affecting pregnancy in first trimester on their problem list.  Patient reports no complaints.  Contractions: Irritability. Vag. Bleeding: None.  Movement: Present. Denies leaking of fluid.   The following portions of the patient's history were reviewed and updated as appropriate: allergies, current medications, past family history, past medical history, past social history, past surgical history and problem list. Problem list updated.  Objective:   Vitals:   12/01/20 0822  BP: 128/83  Pulse: 98  Weight: 281 lb (127.5 kg)    Fetal Status: Fetal Heart Rate (bpm): 148   Movement: Present     General:  Alert, oriented and cooperative. Patient is in no acute distress.  Skin: Skin is warm and dry. No rash noted.   Cardiovascular: Normal heart rate noted  Respiratory: Normal respiratory effort, no problems with respiration noted  Abdomen: Soft, gravid, appropriate for gestational age. Pain/Pressure: Present     Pelvic:  Cervical exam deferred        Extremities: Normal range of motion.  Edema: None  Mental Status: Normal mood and affect. Normal behavior. Normal judgment and thought content.   Urinalysis:      Assessment and Plan:  Pregnancy: [id number] at [redacted]w[redacted]d  1. Supervision of other normal pregnancy, antepartum Stable Pt will consider Tdap vaccine at next visit  2. History of cesarean section TOLAC consent signed today  Preterm labor symptoms and general obstetric precautions including but not limited to vaginal bleeding, contractions, leaking of fluid and fetal movement were reviewed in detail with the patient. Please refer to After Visit Summary for other counseling recommendations.  Return in about  2 weeks (around 12/15/2020) for OB visit, face to face, any provider.   Hermina Staggers, MD

## 2020-12-01 NOTE — Patient Instructions (Signed)

## 2020-12-01 NOTE — Addendum Note (Signed)
Addended by: Hermina Staggers on: 12/01/2020 08:46 AM   Modules accepted: Orders

## 2020-12-15 ENCOUNTER — Ambulatory Visit: Payer: No Typology Code available for payment source

## 2020-12-15 ENCOUNTER — Telehealth: Payer: Self-pay | Admitting: *Deleted

## 2020-12-15 NOTE — Telephone Encounter (Signed)
Pt called to office to discuss flying while pregnant. Pt was given recommendations listed in protocol, no flying after 34 weeks.  Pt states she has had low risk pregnancy and will just be 34 weeks when she plans to be out of town. Pt again advised of recommendations and made aware it is her choice to follow or not. If she chooses to fly, make sure she is able to get up and move/walk.   Pt has no other questions.

## 2020-12-16 ENCOUNTER — Other Ambulatory Visit: Payer: Self-pay

## 2020-12-16 ENCOUNTER — Ambulatory Visit (INDEPENDENT_AMBULATORY_CARE_PROVIDER_SITE_OTHER): Payer: No Typology Code available for payment source | Admitting: Obstetrics and Gynecology

## 2020-12-16 VITALS — BP 121/78 | HR 101 | Wt 282.8 lb

## 2020-12-16 DIAGNOSIS — Z3480 Encounter for supervision of other normal pregnancy, unspecified trimester: Secondary | ICD-10-CM

## 2020-12-16 DIAGNOSIS — Z23 Encounter for immunization: Secondary | ICD-10-CM

## 2020-12-16 MED ORDER — FLUCONAZOLE 150 MG PO TABS: 150.0000 mg | ORAL_TABLET | Freq: Every day | ORAL | 0 refills | Status: DC

## 2020-12-16 MED ORDER — BREAST PUMP MISC: 1.0000 | Freq: Every day | 0 refills | Status: DC

## 2020-12-16 NOTE — Progress Notes (Signed)
   PRENATAL VISIT NOTE  Subjective:  Jacqueline Haas is a 35 y.o. [phone] at [redacted]w[redacted]d being seen today for ongoing prenatal care.  She is currently monitored for the following issues for this low-risk pregnancy and has History of cesarean section; Supervision of other normal pregnancy, antepartum; and Obesity affecting pregnancy in first trimester on their problem list.  Patient reports no complaints.  Contractions: Irritability. Vag. Bleeding: None.  Movement: Present. Denies leaking of fluid.   The following portions of the patient's history were reviewed and updated as appropriate: allergies, current medications, past family history, past medical history, past social history, past surgical history and problem list.   Objective:   Vitals:   12/16/20 0900  BP: 121/78  Pulse: (!) 101  Weight: 282 lb 12.8 oz (128.3 kg)    Fetal Status: Fetal Heart Rate (bpm): 140   Movement: Present     General:  Alert, oriented and cooperative. Patient is in no acute distress.  Skin: Skin is warm and dry. No rash noted.   Cardiovascular: Normal heart rate noted  Respiratory: Normal respiratory effort, no problems with respiration noted  Abdomen: Soft, gravid, appropriate for gestational age.  Pain/Pressure: Absent     Pelvic: Cervical exam deferred        Extremities: Normal range of motion.  Edema: None  Mental Status: Normal mood and affect. Normal behavior. Normal judgment and thought content.   Assessment and Plan:  Pregnancy: [id number] at [redacted]w[redacted]d  1. Supervision of other normal pregnancy, antepartum  Tdap given today Reviewed traveling in the 3rd trimester. She is traveling to [place] on a 1.5 hour direct flight.  Rx: breast pump for insurance.  Hx of recent yeast infection. Completed 7 days of cream, then took 7 days of flagyl for BV. Yeast is still present. Rx: 1 dose of diflucan oral.    Preterm labor symptoms and general obstetric precautions including but not limited to vaginal bleeding,  contractions, leaking of fluid and fetal movement were reviewed in detail with the patient. Please refer to After Visit Summary for other counseling recommendations.   No follow-ups on file.  Future Appointments  Date Time Provider Department Center  12/20/2020  7:30 AM [facility] NURSE [facility]  12/20/2020  7:45 AM [facility] US4 [facility]  12/28/2020  8:15 AM Nugent, Odie Sera, NP [facility] None    Venia Carbon, NP

## 2020-12-20 ENCOUNTER — Encounter: Payer: Self-pay | Admitting: *Deleted

## 2020-12-20 ENCOUNTER — Other Ambulatory Visit: Payer: Self-pay | Admitting: *Deleted

## 2020-12-20 ENCOUNTER — Ambulatory Visit: Payer: No Typology Code available for payment source | Admitting: *Deleted

## 2020-12-20 ENCOUNTER — Other Ambulatory Visit: Payer: Self-pay

## 2020-12-20 ENCOUNTER — Ambulatory Visit: Payer: No Typology Code available for payment source | Attending: Maternal & Fetal Medicine

## 2020-12-20 VITALS — BP 122/65 | HR 88

## 2020-12-20 DIAGNOSIS — Z6839 Body mass index (BMI) 39.0-39.9, adult: Secondary | ICD-10-CM

## 2020-12-20 DIAGNOSIS — O99213 Obesity complicating pregnancy, third trimester: Secondary | ICD-10-CM | POA: Insufficient documentation

## 2020-12-20 DIAGNOSIS — E669 Obesity, unspecified: Secondary | ICD-10-CM | POA: Diagnosis not present

## 2020-12-20 DIAGNOSIS — Z362 Encounter for other antenatal screening follow-up: Secondary | ICD-10-CM | POA: Insufficient documentation

## 2020-12-20 DIAGNOSIS — O34219 Maternal care for unspecified type scar from previous cesarean delivery: Secondary | ICD-10-CM | POA: Insufficient documentation

## 2020-12-20 DIAGNOSIS — Z3A32 32 weeks gestation of pregnancy: Secondary | ICD-10-CM | POA: Insufficient documentation

## 2020-12-20 DIAGNOSIS — Z6841 Body Mass Index (BMI) 40.0 and over, adult: Secondary | ICD-10-CM

## 2020-12-28 ENCOUNTER — Ambulatory Visit (INDEPENDENT_AMBULATORY_CARE_PROVIDER_SITE_OTHER): Payer: No Typology Code available for payment source | Admitting: Women's Health

## 2020-12-28 ENCOUNTER — Other Ambulatory Visit: Payer: Self-pay

## 2020-12-28 VITALS — BP 132/82 | HR 90 | Wt 285.4 lb

## 2020-12-28 DIAGNOSIS — Z3480 Encounter for supervision of other normal pregnancy, unspecified trimester: Secondary | ICD-10-CM

## 2020-12-28 DIAGNOSIS — Z3A34 34 weeks gestation of pregnancy: Secondary | ICD-10-CM

## 2020-12-28 DIAGNOSIS — O99211 Obesity complicating pregnancy, first trimester: Secondary | ICD-10-CM

## 2020-12-28 DIAGNOSIS — Z98891 History of uterine scar from previous surgery: Secondary | ICD-10-CM

## 2020-12-28 MED ORDER — BREAST PUMP MISC: 1.0000 | Freq: Every day | 0 refills | Status: DC

## 2020-12-28 NOTE — Progress Notes (Signed)
Patient reports fetal movement with occasional contractions.

## 2020-12-28 NOTE — Addendum Note (Signed)
Addended by: Natale Milch D on: 12/28/2020 09:00 AM   Modules accepted: Orders

## 2020-12-28 NOTE — Progress Notes (Addendum)
Subjective:  Jacqueline Haas is a 35 y.o. [phone] at [redacted]w[redacted]d being seen today for ongoing prenatal care.  She is currently monitored for the following issues for this low-risk pregnancy and has History of cesarean section; Supervision of other normal pregnancy, antepartum; and Obesity affecting pregnancy in first trimester on their problem list.  Patient reports occasional contractions.  Contractions: Irritability. Vag. Bleeding: None.  Movement: Present. Denies leaking of fluid.   The following portions of the patient's history were reviewed and updated as appropriate: allergies, current medications, past family history, past medical history, past social history, past surgical history and problem list. Problem list updated.  Objective:   Vitals:   12/28/20 0826  BP: 132/82  Pulse: 90  Weight: 285 lb 6.4 oz (129.5 kg)    Fetal Status: Fetal Heart Rate (bpm): 150 Fundal Height: 35 cm Movement: Present     General:  Alert, oriented and cooperative. Patient is in no acute distress.  Skin: Skin is warm and dry. No rash noted.   Cardiovascular: Normal heart rate noted  Respiratory: Normal respiratory effort, no problems with respiration noted  Abdomen: Soft, gravid, appropriate for gestational age. Pain/Pressure: Present     Pelvic: Vag. Bleeding: None     Cervical exam deferred        Extremities: Normal range of motion.  Edema: None  Mental Status: Normal mood and affect. Normal behavior. Normal judgment and thought content.   Urinalysis:      Assessment and Plan:  Pregnancy: [id number] at [redacted]w[redacted]d  1. Supervision of other normal pregnancy, antepartum -GBS/cultures next visit -RX breastpump given -pt with upcoming travel to [place], [place], advised pt to know where closest hospital is with L&D unit, discussed increased hydration, frequent walking, compression socks for travel, pt advised to request paper copy of medical records in case of emergency  2. History of cesarean section -VBAC  consent signed 12/01/2020  3. Obesity affecting pregnancy in first trimester -on low dose ASA -baseline CBC and CMP performed, PcC not performed, will obtain today  4. [redacted] weeks gestation of pregnancy  Preterm labor symptoms and general obstetric precautions including but not limited to vaginal bleeding, contractions, leaking of fluid and fetal movement were reviewed in detail with the patient. I discussed the assessment and treatment plan with the patient. The patient was provided an opportunity to ask questions and all were answered. The patient agreed with the plan and demonstrated an understanding of the instructions. The patient was advised to call back or seek an in-person office evaluation/go to MAU at [facility] for any urgent or concerning symptoms. Please refer to After Visit Summary for other counseling recommendations.  Return in about 2 weeks (around 01/11/2021) for in-person LOB/APP OK/GBS/cultures.   Nicle Connole, Odie Sera, NP

## 2020-12-28 NOTE — Patient Instructions (Signed)
Maternity Assessment Unit (MAU)  The Maternity Assessment Unit (MAU) is located at the [facility] at [facility]. The address is: [address], [address], [place], [place] [address]. Please see map below for additional directions.    The Maternity Assessment Unit is designed to help you during your pregnancy, and for up to 6 weeks after delivery, with any pregnancy- or postpartum-related emergencies, if you think you are in labor, or if your water has broken. For example, if you experience nausea and vomiting, vaginal bleeding, severe abdominal or pelvic pain, elevated blood pressure or other problems related to your pregnancy or postpartum time, please come to the Maternity Assessment Unit for assistance.        

## 2020-12-29 LAB — PROTEIN / CREATININE RATIO, URINE
Creatinine, Urine: 113.3 mg/dL
Protein, Ur: 16.3 mg/dL
Protein/Creat Ratio: 144 mg/g creat (ref 0–200)

## 2021-01-11 ENCOUNTER — Encounter: Payer: Self-pay | Admitting: Obstetrics

## 2021-01-11 ENCOUNTER — Other Ambulatory Visit: Payer: Self-pay

## 2021-01-11 ENCOUNTER — Other Ambulatory Visit (HOSPITAL_COMMUNITY)
Admission: RE | Admit: 2021-01-11 | Discharge: 2021-01-11 | Disposition: A | Discharge status: Home / Self Care | Payer: No Typology Code available for payment source | Source: Ambulatory Visit | Attending: Obstetrics | Admitting: Obstetrics

## 2021-01-11 ENCOUNTER — Ambulatory Visit (INDEPENDENT_AMBULATORY_CARE_PROVIDER_SITE_OTHER): Payer: No Typology Code available for payment source | Admitting: Obstetrics

## 2021-01-11 VITALS — BP 128/83 | HR 103 | Wt 284.3 lb

## 2021-01-11 DIAGNOSIS — Z3A36 36 weeks gestation of pregnancy: Secondary | ICD-10-CM | POA: Diagnosis not present

## 2021-01-11 DIAGNOSIS — Z3483 Encounter for supervision of other normal pregnancy, third trimester: Secondary | ICD-10-CM | POA: Insufficient documentation

## 2021-01-11 DIAGNOSIS — Z3480 Encounter for supervision of other normal pregnancy, unspecified trimester: Secondary | ICD-10-CM

## 2021-01-11 NOTE — Progress Notes (Signed)
ROB, GBS/CT due today

## 2021-01-11 NOTE — Progress Notes (Signed)
Subjective:  Jacqueline Haas is a 34 y.o. [phone] at [redacted]w[redacted]d being seen today for ongoing prenatal care.  She is currently monitored for the following issues for this low-risk pregnancy and has History of cesarean section; Supervision of other normal pregnancy, antepartum; and Obesity affecting pregnancy in first trimester on their problem list.  Patient reports heartburn.  Contractions: Irritability. Vag. Bleeding: None.  Movement: Present. Denies leaking of fluid.   The following portions of the patient's history were reviewed and updated as appropriate: allergies, current medications, past family history, past medical history, past social history, past surgical history and problem list. Problem list updated.  Objective:   Vitals:   01/11/21 1008  BP: 128/83  Pulse: (!) 103  Weight: 284 lb 4.8 oz (129 kg)    Fetal Status:     Movement: Present     General:  Alert, oriented and cooperative. Patient is in no acute distress.  Skin: Skin is warm and dry. No rash noted.   Cardiovascular: Normal heart rate noted  Respiratory: Normal respiratory effort, no problems with respiration noted  Abdomen: Soft, gravid, appropriate for gestational age. Pain/Pressure: Present     Pelvic:  Cervical exam deferred        Extremities: Normal range of motion.  Edema: None  Mental Status: Normal mood and affect. Normal behavior. Normal judgment and thought content.   Urinalysis:      Assessment and Plan:  Pregnancy: [id number] at [redacted]w[redacted]d  1. Supervision of other normal pregnancy, antepartum Rx: - Cervicovaginal ancillary only( [place]) - Strep Gp B NAA  Preterm labor symptoms and general obstetric precautions including but not limited to vaginal bleeding, contractions, leaking of fluid and fetal movement were reviewed in detail with the patient. Please refer to After Visit Summary for other counseling recommendations.   Return in about 1 week (around 01/18/2021) for ROB.   Brock Bad, MD   01/11/21

## 2021-01-12 LAB — CERVICOVAGINAL ANCILLARY ONLY
Chlamydia: NEGATIVE
Comment: NEGATIVE
Comment: NORMAL
Neisseria Gonorrhea: NEGATIVE

## 2021-01-13 ENCOUNTER — Ambulatory Visit: Payer: No Typology Code available for payment source | Admitting: *Deleted

## 2021-01-13 ENCOUNTER — Ambulatory Visit: Payer: No Typology Code available for payment source | Attending: Obstetrics

## 2021-01-13 ENCOUNTER — Other Ambulatory Visit: Payer: Self-pay

## 2021-01-13 ENCOUNTER — Encounter: Payer: Self-pay | Admitting: *Deleted

## 2021-01-13 ENCOUNTER — Other Ambulatory Visit: Payer: Self-pay | Admitting: Obstetrics

## 2021-01-13 VITALS — BP 119/74 | HR 92

## 2021-01-13 DIAGNOSIS — Z6841 Body Mass Index (BMI) 40.0 and over, adult: Secondary | ICD-10-CM

## 2021-01-13 DIAGNOSIS — O34219 Maternal care for unspecified type scar from previous cesarean delivery: Secondary | ICD-10-CM | POA: Diagnosis not present

## 2021-01-13 DIAGNOSIS — O99213 Obesity complicating pregnancy, third trimester: Secondary | ICD-10-CM | POA: Insufficient documentation

## 2021-01-13 DIAGNOSIS — Z362 Encounter for other antenatal screening follow-up: Secondary | ICD-10-CM | POA: Diagnosis not present

## 2021-01-13 DIAGNOSIS — Z3A36 36 weeks gestation of pregnancy: Secondary | ICD-10-CM | POA: Insufficient documentation

## 2021-01-13 DIAGNOSIS — O283 Abnormal ultrasonic finding on antenatal screening of mother: Secondary | ICD-10-CM

## 2021-01-13 LAB — STREP GP B NAA: Strep Gp B NAA: NEGATIVE

## 2021-01-13 NOTE — Procedures (Signed)
Jacqueline Haas Mar 09, 1986 [redacted]w[redacted]d  Fetus A Non-Stress Test Interpretation for 01/13/21  Indication: Unsatisfactory BPP  Fetal Heart Rate A Mode: External Baseline Rate (A): 150 bpm Variability: Moderate Accelerations: 15 x 15 Decelerations: None Multiple birth?: No  Uterine Activity Mode: Palpation, Toco Contraction Frequency (min): 1 uc Contraction Duration (sec): 50 Contraction Quality: Mild Resting Tone Palpated: Relaxed Resting Time: Adequate  Interpretation (Fetal Testing) Nonstress Test Interpretation: Reactive Overall Impression: Reassuring for gestational age Comments: Dr.Fang reviewed tracing

## 2021-01-18 ENCOUNTER — Other Ambulatory Visit: Payer: Self-pay

## 2021-01-18 ENCOUNTER — Ambulatory Visit (INDEPENDENT_AMBULATORY_CARE_PROVIDER_SITE_OTHER): Payer: No Typology Code available for payment source | Admitting: Women's Health

## 2021-01-18 VITALS — BP 130/82 | HR 118 | Wt 288.8 lb

## 2021-01-18 DIAGNOSIS — Z98891 History of uterine scar from previous surgery: Secondary | ICD-10-CM

## 2021-01-18 DIAGNOSIS — O99211 Obesity complicating pregnancy, first trimester: Secondary | ICD-10-CM

## 2021-01-18 DIAGNOSIS — Z3A37 37 weeks gestation of pregnancy: Secondary | ICD-10-CM

## 2021-01-18 DIAGNOSIS — Z3480 Encounter for supervision of other normal pregnancy, unspecified trimester: Secondary | ICD-10-CM

## 2021-01-18 NOTE — Progress Notes (Signed)
Subjective:  Jacqueline Haas is a 35 y.o. [phone] at [redacted]w[redacted]d being seen today for ongoing prenatal care.  She is currently monitored for the following issues for this low-risk pregnancy and has History of cesarean section; Supervision of other normal pregnancy, antepartum; and Obesity affecting pregnancy in first trimester on their problem list.  Patient reports no complaints.  Contractions: Not present. Vag. Bleeding: None.  Movement: Present. Denies leaking of fluid.   The following portions of the patient's history were reviewed and updated as appropriate: allergies, current medications, past family history, past medical history, past social history, past surgical history and problem list. Problem list updated.  Objective:   Vitals:   01/18/21 1000  BP: 130/82  Pulse: (!) 118  Weight: 288 lb 12.8 oz (131 kg)    Fetal Status: Fetal Heart Rate (bpm): 150 Fundal Height: 37 cm Movement: Present     General:  Alert, oriented and cooperative. Patient is in no acute distress.  Skin: Skin is warm and dry. No rash noted.   Cardiovascular: Normal heart rate noted  Respiratory: Normal respiratory effort, no problems with respiration noted  Abdomen: Soft, gravid, appropriate for gestational age. Pain/Pressure: Absent     Pelvic: Vag. Bleeding: None     Cervical exam deferred        Extremities: Normal range of motion.  Edema: None  Mental Status: Normal mood and affect. Normal behavior. Normal judgment and thought content.   Urinalysis:      Assessment and Plan:  Pregnancy: [id number] at [redacted]w[redacted]d  1. Supervision of other normal pregnancy, antepartum -Reviewed labor readiness with patient including the [employer], evening primrose oil, and raspberry leaf tea.  -pt upset about not being offered WB earlier in the pregnancy; pt advised exclusion criteria include previous C/S. Pt also expresses interest in discussing birth plan with midwife next visit as she does not want to be "strapped to  bed." -pt initially requesting cervical exam, discussed utility at this time, pt declines cervical exam after discussion and repeat offers to check cervix prior to end of visit  2. History of cesarean section -VBAC consent signed 12/01/2020  3. Obesity affecting pregnancy in first trimester -on low dose ASA  4. [redacted] weeks gestation of pregnancy  Term labor symptoms and general obstetric precautions including but not limited to vaginal bleeding, contractions, leaking of fluid and fetal movement were reviewed in detail with the patient. I discussed the assessment and treatment plan with the patient. The patient was provided an opportunity to ask questions and all were answered. The patient agreed with the plan and demonstrated an understanding of the instructions. The patient was advised to call back or seek an in-person office evaluation/go to MAU at [facility] for any urgent or concerning symptoms. Please refer to After Visit Summary for other counseling recommendations.  Return in about 1 week (around 01/25/2021) for in-person LOB/MIDWIFE ONLY/discuss birth plan.   Markos Theil, Odie Sera, NP

## 2021-01-18 NOTE — Patient Instructions (Addendum)
Maternity Assessment Unit (MAU)  The Maternity Assessment Unit (MAU) is located at the [facility] at [facility]. The address is: [address] [place], [place], [place] [address]. Please see map below for additional directions.    The Maternity Assessment Unit is designed to help you during your pregnancy, and for up to 6 weeks after delivery, with any pregnancy- or postpartum-related emergencies, if you think you are in labor, or if your water has broken. For example, if you experience nausea and vomiting, vaginal bleeding, severe abdominal or pelvic pain, elevated blood pressure or other problems related to your pregnancy or postpartum time, please come to the Maternity Assessment Unit for assistance.       Things to Try After 37 weeks to Encourage Labor/Get Ready for Labor:    Try the [employer] at https://glass.com/.com daily to improve baby's position and encourage the onset of labor.  Walk a little and rest a little every day.  Change positions often.  Cervical Ripening: May try one or both Red Raspberry Leaf capsules or tea:  two 300mg  or 400mg  tablets with each meal, 2-3 times a day, or 1-3 cups of tea daily  Potential Side Effects Of Raspberry Leaf:  Most women do not experience any side effects from drinking raspberry leaf tea. However, nausea and loose stools are possible   Evening Primrose Oil capsules: take 1 capsule by mouth and place one capsule in the vagina every night.    Some of the potential side effects:  Upset stomach  Loose stools or diarrhea  Headaches  Nausea  Sex can also help the cervix ripen and encourage labor onset.        Reasons to return to MAU at [facility]:  1.  Contractions are  5 minutes apart or less, each last 1 minute, these have been going on for 1-2 hours, and you cannot walk or talk during them 2.  You have a large gush of fluid, or a trickle of fluid that will not  stop and you have to wear a pad 3.  You have bleeding that is bright red, heavier than spotting--like menstrual bleeding (spotting can be normal in early labor or after a check of your cervix) 4.  You do not feel the baby moving like he/she normally does       Signs and Symptoms of Labor Labor is the body's natural process of moving the baby and the placenta out of the uterus. The process of labor usually starts when the baby is full-term,between 37 and 40 weeks of pregnancy. Signs and symptoms that you are close to going into labor As your body prepares for labor and the birth of your baby, you may notice the following symptoms in the weeks and days before true labor starts: Passing a small amount of thick, bloody mucus from your vagina. This is called normal bloody show or losing your mucus plug. This may happen more than a week before labor begins, or right before labor begins, as the opening of the cervix starts to widen (dilate). For some women, the entire mucus plug passes at once. For others, pieces of the mucus plug may gradually pass over several days. Your baby moving (dropping) lower in your pelvis to get into position for birth (lightening). When this happens, you may feel more pressure on your bladder and pelvic bone and less pressure on your ribs. This may make it easier to breathe. It may  also cause you to need to urinate more often and have problems with bowel movements. Having "practice contractions," also called Braxton Hicks contractions or false labor. These occur at irregular (unevenly spaced) intervals that are more than 10 minutes apart. False labor contractions are common after exercise or sexual activity. They will stop if you change position, rest, or drink fluids. These contractions are usually mild and do not get stronger over time. They may feel like: A backache or back pain. Mild cramps, similar to menstrual cramps. Tightening or pressure in your abdomen. Other early  symptoms include: Nausea or loss of appetite. Diarrhea. Having a sudden burst of energy, or feeling very tired. Mood changes. Having trouble sleeping. Signs and symptoms that labor has begun Signs that you are in labor may include: Having contractions that come at regular (evenly spaced) intervals and increase in intensity. This may feel like more intense tightening or pressure in your abdomen that moves to your back. Contractions may also feel like rhythmic pain in your upper thighs or back that comes and goes at regular intervals. For first-time mothers, this change in intensity of contractions often occurs at a more gradual pace. Women who have given birth before may notice a more rapid progression of contraction changes. Feeling pressure in the vaginal area. Your water breaking (rupture of membranes). This is when the sac of fluid that surrounds your baby breaks. Fluid leaking from your vagina may be clear or blood-tinged. Labor usually starts within 24 hours of your water breaking, but it may take longer to begin. Some women may feel a sudden gush of fluid. Others notice that their underwear repeatedly becomes damp. Follow these instructions at home:  When labor starts, or if your water breaks, call your health care provider or nurse care line. Based on your situation, they will determine when you should go in for an exam. During early labor, you may be able to rest and manage symptoms at home. Some strategies to try at home include: Breathing and relaxation techniques. Taking a warm bath or shower. Listening to music. Using a heating pad on the lower back for pain. If you are directed to use heat: Place a towel between your skin and the heat source. Leave the heat on for 20-30 minutes. Remove the heat if your skin turns bright red. This is especially important if you are unable to feel pain, heat, or cold. You may have a greater risk of getting burned. Contact a health care provider  if: Your labor has started. Your water breaks. Get help right away if: You have painful, regular contractions that are 5 minutes apart or less. Labor starts before you are [redacted] weeks along in your pregnancy. You have a fever. You have bright red blood coming from your vagina. You do not feel your baby moving. You have a severe headache with or without vision problems. You have severe nausea, vomiting, or diarrhea. You have chest pain or shortness of breath. These symptoms may represent a serious problem that is an emergency. Do not wait to see if the symptoms will go away. Get medical help right away. Call your local emergency services (911 in the [place] Do not drive yourself to the hospital. Summary Labor is your body's natural process of moving your baby and the placenta out of your uterus. The process of labor usually starts when your baby is full-term, between 73 and 40 weeks of pregnancy. When labor starts, or if your water breaks, call your health care  provider or nurse care line. Based on your situation, they will determine when you should go in for an exam. This information is not intended to replace advice given to you by your health care provider. Make sure you discuss any questions you have with your healthcare provider. Document Revised: 04/23/2020 Document Reviewed: 04/23/2020 Elsevier Patient Education  2022 Elsevier Inc.       Vaginal Birth After Cesarean Delivery  Vaginal birth after cesarean delivery (VBAC) means giving birth vaginally after previously delivering a baby through a cesarean section, or C-section. VBAC maybe a safe option for you, depending on your health and other factors. Discuss VBAC with your health care provider early in your pregnancy, so you can understand the risks, benefits, and options. Having these discussions earlywill give you time to make your birth plan. What increases the chances for a successful VBAC? These factors increase your chances of  a successful VBAC: You have had only one previous C-section. You had a low transverse incision for your C-section. You have had a successful vaginal birth. Your labor starts naturally on or before your due date. You and your baby have had a healthy pregnancy. Your baby is head-down. What happens when I arrive at the birth center or hospital? Once you are in labor and have been admitted into the hospital or birth center, your health care team may: Review your pregnancy history and go over any concerns you have. Talk with you about your birth plan and discuss pain control options. Check your blood pressure, breathing rate, and heart rate. Check your baby's heartbeat. Monitor your uterus for contractions. Check whether your bag of water (amniotic sac) has broken (ruptured). Insert an IV into one of your veins. You may get fluids and medicine through the IV. Monitoring and exams Your health care team may check your contractions (uterine monitoring) and your baby's heart rate (fetal monitoring). You may need to be monitored for long periods at a time (continuously) with a monitoring device. Your health care provider may also do frequent physical exams. These may include checking how and where your baby is positioned in your uterus and checking your cervix to determine whether it is opening up (dilating) or thinning out (effacing). What happens during labor and delivery? Normal labor and delivery are divided into the following three stages: Stage 1 This is the longest stage of labor. Throughout this stage, you will feel contractions. Contractions are generally mild, infrequent, and irregular at first. They get stronger, more frequent (about every 2-3 minutes), and more regular as you move through this stage. The first stage ends when your cervix is completely dilated to 4 inches (10 cm) and effaced. Stage 2 This stage starts once your cervix is completely dilated and effaced and lasts until you  deliver your baby. In this stage, you will feel the urge to push your baby out of your vagina. You may feel stretching and burning pain, especially when the widest part of your baby's head passes through the vaginal opening (crowning). Once your baby is delivered, the umbilical cord will be clamped and cut. Your baby will be placed on your bare chest and covered with a warm blanket. If you are planning to breastfeed, watch your baby for feeding cues, like rooting or sucking. Stage 3 This stage starts immediately after your baby is born and ends after you deliver the placenta. This stage may take anywhere from 5 to 30 minutes. What can I expect after labor and delivery? After labor is  over, you and your baby will be checked to make sure you are both healthy, and you will both be monitored until you are ready to go home. You may be encouraged to stay in the same room with your baby to promote early bonding and successful breastfeeding. Your uterus will be checked and massaged regularly (fundal massage). You may continue to receive fluids and medicines through an IV. You will have some soreness and pain in your abdomen, vagina, and the area of skin between your vaginal opening and your anus (perineum). If an incision was made near your vagina (episiotomy) or if you had some vaginal tearing during delivery, cold compresses may be used to reduce pain and swelling. Follow these instructions at home: Incision care If you have an episiotomy incision, follow instructions from your health care provider about how to take care of your incision. Check your incision area every day for signs of infection. Check for: Redness, swelling, or pain. More fluid or blood. Warmth. Pus or a bad smell. If directed, put ice on the episiotomy area. To do this: Put ice in a plastic bag. Place a towel between your skin and the bag. Leave the ice on for 20 minutes, 2-3 times a day. Remove the ice if your skin turns  bright red. This is very important. If you cannot feel pain, heat, or cold, you have a greater risk of damage to the area. Activity Return to your normal activities as told by your health care provider. Ask your health care provider what activities are safe for you. Avoid sitting for a long time without moving. Get up to take short walks every 1-2 hours. General instructions Take over-the-counter and prescription medicines only as told by your health care provider. Do not take baths, swim, or use a hot tub until your health care provider approves. Ask if you may take showers. You may only be allowed to take sponge baths. It is normal to have vaginal bleeding after delivery. Wear a sanitary pad for vaginal bleeding and discharge. Keep all follow-up visits. This is important. Where to find more information American Pregnancy Association: americanpregnancy.org [employer]nec[employer]: acog.org Summary Vaginal birth after cesarean delivery (VBAC) means giving birth vaginally after previously delivering a baby through a cesarean section, or C-section. Once you are in labor and have been admitted into the hospital or birth center, your health care team may review your pregnancy history and go over any concerns you have. Although most women are able to have a successful VBAC, sometimes it is necessary to have another C-section. Keep all follow-up visits. This is important. This information is not intended to replace advice given to you by your health care provider. Make sure you discuss any questions you have with your healthcare provider. Document Revised: 06/30/2020 Document Reviewed: 06/30/2020 Elsevier Patient Education  2022 [employer]

## 2021-01-20 ENCOUNTER — Ambulatory Visit: Payer: No Typology Code available for payment source | Admitting: *Deleted

## 2021-01-20 ENCOUNTER — Encounter: Payer: Self-pay | Admitting: *Deleted

## 2021-01-20 ENCOUNTER — Other Ambulatory Visit: Payer: Self-pay

## 2021-01-20 ENCOUNTER — Ambulatory Visit: Payer: No Typology Code available for payment source | Attending: Obstetrics | Admitting: *Deleted

## 2021-01-20 VITALS — BP 138/57 | HR 90

## 2021-01-20 DIAGNOSIS — Z6841 Body Mass Index (BMI) 40.0 and over, adult: Secondary | ICD-10-CM

## 2021-01-20 DIAGNOSIS — Z3A37 37 weeks gestation of pregnancy: Secondary | ICD-10-CM | POA: Diagnosis not present

## 2021-01-20 DIAGNOSIS — O99213 Obesity complicating pregnancy, third trimester: Secondary | ICD-10-CM | POA: Insufficient documentation

## 2021-01-20 DIAGNOSIS — E669 Obesity, unspecified: Secondary | ICD-10-CM

## 2021-01-20 DIAGNOSIS — Z6837 Body mass index (BMI) 37.0-37.9, adult: Secondary | ICD-10-CM

## 2021-01-20 NOTE — Procedures (Signed)
Jacqueline Haas April 02, 1986 [redacted]w[redacted]d  Fetus A Non-Stress Test Interpretation for 01/20/21  Indication:  Obesity  Fetal Heart Rate A Mode: External Baseline Rate (A): 140 bpm Variability: Moderate Accelerations: 15 x 15 Decelerations: None Multiple birth?: No  Uterine Activity Mode: Palpation, Toco Contraction Frequency (min): none Resting Tone Palpated: Relaxed Resting Time: Adequate  Interpretation (Fetal Testing) Nonstress Test Interpretation: Reactive Overall Impression: Reassuring for gestational age Comments: Dr. Parke Poisson reviewed tracing.

## 2021-01-26 ENCOUNTER — Ambulatory Visit (INDEPENDENT_AMBULATORY_CARE_PROVIDER_SITE_OTHER): Payer: No Typology Code available for payment source | Admitting: Obstetrics and Gynecology

## 2021-01-26 ENCOUNTER — Ambulatory Visit (INDEPENDENT_AMBULATORY_CARE_PROVIDER_SITE_OTHER): Payer: No Typology Code available for payment source

## 2021-01-26 ENCOUNTER — Ambulatory Visit: Payer: No Typology Code available for payment source | Admitting: *Deleted

## 2021-01-26 ENCOUNTER — Other Ambulatory Visit: Payer: Self-pay

## 2021-01-26 ENCOUNTER — Encounter: Payer: Self-pay | Admitting: Obstetrics and Gynecology

## 2021-01-26 VITALS — BP 135/83 | HR 87

## 2021-01-26 VITALS — BP 110/53 | HR 112 | Wt 290.0 lb

## 2021-01-26 DIAGNOSIS — O99211 Obesity complicating pregnancy, first trimester: Secondary | ICD-10-CM

## 2021-01-26 DIAGNOSIS — Z3480 Encounter for supervision of other normal pregnancy, unspecified trimester: Secondary | ICD-10-CM

## 2021-01-26 DIAGNOSIS — Z98891 History of uterine scar from previous surgery: Secondary | ICD-10-CM

## 2021-01-26 NOTE — Patient Instructions (Signed)
Vaginal Delivery ?Vaginal delivery means that you give birth by pushing your baby out of your birth canal (vagina). Your health care team will help you before, during, and after vaginal delivery. ?Birth experiences are unique for every woman and every pregnancy, and birth experiences vary depending on where you choose to give birth. ?What are the risks and benefits? ?Generally, this is safe. However, problems may occur, including: ?Bleeding. ?Infection. ?Damage to other structures such as vaginal tearing. ?Allergic reactions to medicines. ?Despite the risks, benefits of vaginal delivery include less risk of bleeding and infection and a shorter recovery time compared to a Cesarean delivery. Cesarean delivery, or C-section, is the surgical delivery of a baby. ?What happens when I arrive at the birth center or hospital? ?Once you are in labor and have been admitted into the hospital or birth center, your health care team may: ?Review your pregnancy history and any concerns that you have. ?Talk with you about your birth plan and discuss pain control options. ?Check your blood pressure, breathing, and heartbeat. ?Assess your baby's heartbeat. ?Monitor your uterus for contractions. ?Check whether your bag of water (amniotic sac) has broken (ruptured). ?Insert an IV into one of your veins. This may be used to give you fluids and medicines. ?Monitoring ?Your health care team may assess your contractions (uterine monitoring) and your baby's heart rate (fetal monitoring). You may need to be monitored: ?Often, but not continuously (intermittently). ?All the time or for long periods at a time (continuously). Continuous monitoring may be needed if: ?You are taking certain medicines, such as medicine to relieve pain or make your contractions stronger. ?You have pregnancy or labor complications. ?Monitoring may be done by: ?Placing a special stethoscope or a handheld monitoring device on your abdomen to check your baby's heartbeat  and to check for contractions. ?Placing monitors on your abdomen (external monitors) to record your baby's heartbeat and the frequency and length of contractions. ?Placing monitors inside your uterus through your vagina (internal monitors) to record your baby's heartbeat and the frequency, length, and strength of your contractions. Depending on the type of monitor, it may remain in your uterus or on your baby's head until birth. ?Telemetry. This is a type of continuous monitoring that can be done with external or internal monitors. Instead of having to stay in bed, you are able to move around. ?Physical exam ?Your health care team may perform frequent physical exams. This may include: ?Checking how and where your baby is positioned in your uterus. ?Checking your cervix to determine: ?Whether it is thinning out (effacing). ?Whether it is opening up (dilating). ?What happens during labor and delivery? ?Normal labor and delivery is divided into the following three stages: ?Stage 1 ?This is the longest stage of labor. ?Throughout this stage, you will feel contractions. Contractions generally feel mild, infrequent, and irregular at first. They get stronger, more frequent, and more regular as you move through this stage. You may have contractions about every 2-3 minutes. ?This stage ends when your cervix is completely dilated to 4 inches (10 cm) and completely effaced. ?Stage 2 ?This stage starts once your cervix is completely effaced and dilated and lasts until the delivery of your baby. ?This is the stage where you will feel an urge to push your baby out of your vagina. ?You may feel stretching and burning pain, especially when the widest part of your baby's head passes through the vaginal opening (crowning). ?Once your baby is delivered, the umbilical cord will be clamped and   cut. Timing of cutting the cord will depend on your wishes, your baby's health, and your health care provider's practices. ?Your baby will be  placed on your bare chest (skin-to-skin contact) in an upright position and covered with a warm blanket. If you are choosing to breastfeed, watch your baby for feeding cues, like rooting or sucking, and help the baby to your breast for his or her first feeding. ?Stage 3 ?This stage starts immediately after the birth of your baby and ends after you deliver the placenta. ?This stage may take anywhere from 5 to 30 minutes. ?After your baby has been delivered, you will feel contractions as your body expels the placenta. These contractions also help your uterus get smaller and reduce bleeding. ?What can I expect after labor and delivery? ?After labor is over, you and your baby will be assessed closely until you are ready to go home. Your health care team will teach you how to care for yourself and your baby. ?You and your baby may be encouraged to stay in the same room (rooming in) during your hospital stay. This will help promote early bonding and successful breastfeeding. ?Your uterus will be checked and massaged regularly (fundal massage). ?You may continue to receive fluids and medicines through an IV. ?You will have some soreness and pain in your abdomen, vagina, and the area of skin between your vaginal opening and your anus (perineum). ?If an incision was made near your vagina (episiotomy) or if you had some vaginal tearing during delivery, cold compresses may be placed on your episiotomy or your tear. This helps to reduce pain and swelling. ?It is normal to have vaginal bleeding after delivery. Wear a sanitary pad for vaginal bleeding and discharge. ?Summary ?Vaginal delivery means that you will give birth by pushing your baby out of your birth canal (vagina). ?Your health care team will monitor you and your baby throughout the stages of labor. ?After you deliver your baby, your health care team will continue to assess you and your baby to ensure you are both recovering as expected after delivery. ?This  information is not intended to replace advice given to you by your health care provider. Make sure you discuss any questions you have with your health care provider. ?Document Revised: 05/30/2020 Document Reviewed: 05/30/2020 ?Elsevier Patient Education ? 2022 Elsevier Inc. ? ?

## 2021-01-26 NOTE — Progress Notes (Signed)
+   Fetal movement. No complaints. Pt has BPP/NST later today.

## 2021-01-26 NOTE — Progress Notes (Signed)
Subjective:  Jacqueline Haas is a 35 y.o. [phone] at [redacted]w[redacted]d being seen today for ongoing prenatal care.  She is currently monitored for the following issues for this low-risk pregnancy and has History of cesarean section; Supervision of other normal pregnancy, antepartum; and Obesity affecting pregnancy in first trimester on their problem list.  Patient reports general discomforts of pregnancy.  Contractions: Irritability. Vag. Bleeding: None.  Movement: Present. Denies leaking of fluid.   The following portions of the patient's history were reviewed and updated as appropriate: allergies, current medications, past family history, past medical history, past social history, past surgical history and problem list. Problem list updated.  Objective:   Vitals:   01/26/21 1006  BP: (!) 110/53  Pulse: (!) 112  Weight: 290 lb (131.5 kg)    Fetal Status:     Movement: Present     General:  Alert, oriented and cooperative. Patient is in no acute distress.  Skin: Skin is warm and dry. No rash noted.   Cardiovascular: Normal heart rate noted  Respiratory: Normal respiratory effort, no problems with respiration noted  Abdomen: Soft, gravid, appropriate for gestational age. Pain/Pressure: Absent     Pelvic:  Cervical exam deferred        Extremities: Normal range of motion.  Edema: None  Mental Status: Normal mood and affect. Normal behavior. Normal judgment and thought content.   Urinalysis:      Assessment and Plan:  Pregnancy: [id number] at [redacted]w[redacted]d  1. Supervision of other normal pregnancy, antepartum Labor precautions  2. History of cesarean section TOLAC consent signed  3. Obesity affecting pregnancy in first trimester Growth 88 % on 01/13/21 BPP/NST today  Term labor symptoms and general obstetric precautions including but not limited to vaginal bleeding, contractions, leaking of fluid and fetal movement were reviewed in detail with the patient. Please refer to After Visit Summary for  other counseling recommendations.  Return in about 1 week (around 02/02/2021) for OB visit, face to face, any provider.   Hermina Staggers, MD

## 2021-01-26 NOTE — Progress Notes (Signed)

## 2021-02-01 ENCOUNTER — Ambulatory Visit: Payer: No Typology Code available for payment source | Admitting: *Deleted

## 2021-02-01 ENCOUNTER — Other Ambulatory Visit: Payer: Self-pay

## 2021-02-01 ENCOUNTER — Ambulatory Visit (INDEPENDENT_AMBULATORY_CARE_PROVIDER_SITE_OTHER): Payer: No Typology Code available for payment source

## 2021-02-01 VITALS — BP 122/80 | HR 91

## 2021-02-01 DIAGNOSIS — O99211 Obesity complicating pregnancy, first trimester: Secondary | ICD-10-CM | POA: Diagnosis not present

## 2021-02-01 DIAGNOSIS — Z3A39 39 weeks gestation of pregnancy: Secondary | ICD-10-CM | POA: Diagnosis not present

## 2021-02-01 DIAGNOSIS — O99210 Obesity complicating pregnancy, unspecified trimester: Secondary | ICD-10-CM

## 2021-02-01 NOTE — Progress Notes (Signed)

## 2021-02-02 ENCOUNTER — Encounter: Payer: Self-pay | Admitting: Obstetrics

## 2021-02-02 ENCOUNTER — Ambulatory Visit (INDEPENDENT_AMBULATORY_CARE_PROVIDER_SITE_OTHER): Payer: No Typology Code available for payment source | Admitting: Obstetrics

## 2021-02-02 VITALS — BP 127/83 | HR 99 | Wt 294.0 lb

## 2021-02-02 DIAGNOSIS — Z98891 History of uterine scar from previous surgery: Secondary | ICD-10-CM

## 2021-02-02 DIAGNOSIS — Z3480 Encounter for supervision of other normal pregnancy, unspecified trimester: Secondary | ICD-10-CM

## 2021-02-02 DIAGNOSIS — O99213 Obesity complicating pregnancy, third trimester: Secondary | ICD-10-CM

## 2021-02-02 DIAGNOSIS — O34219 Maternal care for unspecified type scar from previous cesarean delivery: Secondary | ICD-10-CM

## 2021-02-02 NOTE — Progress Notes (Signed)
Subjective:  Jacqueline Haas is a 35 y.o. [phone] at [redacted]w[redacted]d being seen today for ongoing prenatal care.  She is currently monitored for the following issues for this low-risk pregnancy and has History of cesarean section; Supervision of other normal pregnancy, antepartum; and Obesity in pregnancy, antepartum on their problem list.  Patient reports no complaints.  She states that she had a fall earlier this morning but had no significant trauma to the abdomen and is not experiencing any pain, cramping or vaginal bleeding.  She has good fetal movement.  Contractions: Irritability. Vag. Bleeding: None.  Movement: Present. Denies leaking of fluid.   The following portions of the patient's history were reviewed and updated as appropriate: allergies, current medications, past family history, past medical history, past social history, past surgical history and problem list. Problem list updated.  Objective:   Vitals:   02/02/21 1012  BP: 127/83  Pulse: 99  Weight: 294 lb (133.4 kg)    Fetal Status:     Movement: Present     General:  Alert, oriented and cooperative. Patient is in no acute distress.  Skin: Skin is warm and dry. No rash noted.   Cardiovascular: Normal heart rate noted  Respiratory: Normal respiratory effort, no problems with respiration noted  Abdomen: Soft, gravid, appropriate for gestational age. Pain/Pressure: Absent     Pelvic:  Cervical exam deferred        Extremities: Normal range of motion.  Edema: None  Mental Status: Normal mood and affect. Normal behavior. Normal judgment and thought content.   Urinalysis:      Assessment and Plan:  Pregnancy: [id number] at [redacted]w[redacted]d  1. Supervision of other normal pregnancy, antepartum  2. History of cesarean section for fetal intolerance of labo  3. Patient desires vaginal birth after cesarean section (VBAC)  4. Obesity affecting pregnancy in third trimester - delivery planned no later than 40 weeks   Term labor symptoms and  general obstetric precautions including but not limited to vaginal bleeding, contractions, leaking of fluid and fetal movement were reviewed in detail with the patient. Please refer to After Visit Summary for other counseling recommendations.   Return in about 2 weeks (around 02/16/2021) for Postpartum.   Brock Bad, MD  02/02/21
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
## 2021-02-03 ENCOUNTER — Telehealth (HOSPITAL_COMMUNITY): Payer: Self-pay | Admitting: *Deleted

## 2021-02-03 NOTE — Telephone Encounter (Signed)
Preadmission screen  

## 2021-02-05 ENCOUNTER — Other Ambulatory Visit: Payer: Self-pay | Admitting: Advanced Practice Midwife

## 2021-02-06 ENCOUNTER — Other Ambulatory Visit (HOSPITAL_COMMUNITY)
Admission: RE | Admit: 2021-02-06 | Discharge: 2021-02-06 | Disposition: A | Discharge status: Home / Self Care | Payer: No Typology Code available for payment source | Source: Ambulatory Visit | Attending: Obstetrics & Gynecology | Admitting: Obstetrics & Gynecology

## 2021-02-06 ENCOUNTER — Telehealth: Payer: Self-pay

## 2021-02-06 ENCOUNTER — Other Ambulatory Visit: Payer: Self-pay | Admitting: Obstetrics and Gynecology

## 2021-02-06 MED ORDER — TERCONAZOLE 0.8 % VA CREA: 1.0000 | TOPICAL_CREAM | Freq: Every day | VAGINAL | 0 refills | Status: DC

## 2021-02-06 NOTE — Telephone Encounter (Signed)
Pt wants diflucan for yeast I let her know this is not on protocol I would need to ask provider pt made aware to allow 24hrs for approval for medication request.  Pt states last provider that sent diflucan told her to just call when she needed it again I let her know that provider is not here today  Pt due to be induced this week "does not want to deal with yeast infection". Please advise.

## 2021-02-08 ENCOUNTER — Inpatient Hospital Stay (HOSPITAL_COMMUNITY)
Admission: AD | Admit: 2021-02-08 | Discharge: 2021-02-10 | DRG: 788 | Disposition: A | Discharge status: Home / Self Care | Payer: No Typology Code available for payment source | Attending: Obstetrics and Gynecology | Admitting: Obstetrics and Gynecology

## 2021-02-08 ENCOUNTER — Encounter (HOSPITAL_COMMUNITY): Payer: Self-pay | Admitting: Obstetrics & Gynecology

## 2021-02-08 ENCOUNTER — Inpatient Hospital Stay (HOSPITAL_COMMUNITY): Payer: No Typology Code available for payment source | Admitting: Anesthesiology

## 2021-02-08 ENCOUNTER — Inpatient Hospital Stay (HOSPITAL_COMMUNITY)
Admission: AD | Admit: 2021-02-08 | Payer: No Typology Code available for payment source | Source: Home / Self Care | Admitting: Obstetrics and Gynecology

## 2021-02-08 ENCOUNTER — Encounter (HOSPITAL_COMMUNITY)
Admission: AD | Disposition: A | Discharge status: Home / Self Care | Payer: Self-pay | Source: Home / Self Care | Attending: Obstetrics and Gynecology

## 2021-02-08 ENCOUNTER — Other Ambulatory Visit: Payer: Self-pay

## 2021-02-08 ENCOUNTER — Inpatient Hospital Stay (HOSPITAL_COMMUNITY): Payer: No Typology Code available for payment source

## 2021-02-08 DIAGNOSIS — O9902 Anemia complicating childbirth: Secondary | ICD-10-CM | POA: Diagnosis present

## 2021-02-08 DIAGNOSIS — K5900 Constipation, unspecified: Secondary | ICD-10-CM | POA: Diagnosis not present

## 2021-02-08 DIAGNOSIS — Z20822 Contact with and (suspected) exposure to covid-19: Secondary | ICD-10-CM | POA: Diagnosis present

## 2021-02-08 DIAGNOSIS — O34211 Maternal care for low transverse scar from previous cesarean delivery: Secondary | ICD-10-CM | POA: Diagnosis present

## 2021-02-08 DIAGNOSIS — O99214 Obesity complicating childbirth: Principal | ICD-10-CM | POA: Diagnosis present

## 2021-02-08 DIAGNOSIS — O1404 Mild to moderate pre-eclampsia, complicating childbirth: Secondary | ICD-10-CM | POA: Diagnosis present

## 2021-02-08 DIAGNOSIS — O1400 Mild to moderate pre-eclampsia, unspecified trimester: Secondary | ICD-10-CM | POA: Diagnosis not present

## 2021-02-08 DIAGNOSIS — O99893 Other specified diseases and conditions complicating puerperium: Secondary | ICD-10-CM | POA: Diagnosis not present

## 2021-02-08 DIAGNOSIS — O99210 Obesity complicating pregnancy, unspecified trimester: Secondary | ICD-10-CM | POA: Diagnosis present

## 2021-02-08 DIAGNOSIS — O134 Gestational [pregnancy-induced] hypertension without significant proteinuria, complicating childbirth: Secondary | ICD-10-CM | POA: Diagnosis not present

## 2021-02-08 DIAGNOSIS — Z3480 Encounter for supervision of other normal pregnancy, unspecified trimester: Secondary | ICD-10-CM

## 2021-02-08 DIAGNOSIS — R141 Gas pain: Secondary | ICD-10-CM | POA: Diagnosis not present

## 2021-02-08 DIAGNOSIS — O26893 Other specified pregnancy related conditions, third trimester: Secondary | ICD-10-CM | POA: Diagnosis present

## 2021-02-08 DIAGNOSIS — Z87891 Personal history of nicotine dependence: Secondary | ICD-10-CM | POA: Diagnosis not present

## 2021-02-08 DIAGNOSIS — Z6841 Body Mass Index (BMI) 40.0 and over, adult: Secondary | ICD-10-CM

## 2021-02-08 DIAGNOSIS — Z98891 History of uterine scar from previous surgery: Secondary | ICD-10-CM

## 2021-02-08 DIAGNOSIS — Z3A40 40 weeks gestation of pregnancy: Secondary | ICD-10-CM | POA: Diagnosis not present

## 2021-02-08 LAB — TYPE AND SCREEN
ABO/RH(D): O POS
Antibody Screen: NEGATIVE

## 2021-02-08 LAB — CBC
HCT: 34.9 % — ABNORMAL LOW (ref 36.0–46.0)
Hemoglobin: 11.3 g/dL — ABNORMAL LOW (ref 12.0–15.0)
MCH: 26.5 pg (ref 26.0–34.0)
MCHC: 32.4 g/dL (ref 30.0–36.0)
MCV: 81.7 fL (ref 80.0–100.0)
Platelets: 328 10*3/uL (ref 150–400)
RBC: 4.27 MIL/uL (ref 3.87–5.11)
RDW: 16.7 % — ABNORMAL HIGH (ref 11.5–15.5)
WBC: 15.6 10*3/uL — ABNORMAL HIGH (ref 4.0–10.5)
nRBC: 0 % (ref 0.0–0.2)

## 2021-02-08 LAB — COMPREHENSIVE METABOLIC PANEL
ALT: 18 U/L (ref 0–44)
AST: 25 U/L (ref 15–41)
Albumin: 2.8 g/dL — ABNORMAL LOW (ref 3.5–5.0)
Alkaline Phosphatase: 118 U/L (ref 38–126)
Anion gap: 9 (ref 5–15)
BUN: 6 mg/dL (ref 6–20)
CO2: 21 mmol/L — ABNORMAL LOW (ref 22–32)
Calcium: 9.1 mg/dL (ref 8.9–10.3)
Chloride: 103 mmol/L (ref 98–111)
Creatinine, Ser: 0.64 mg/dL (ref 0.44–1.00)
GFR, Estimated: >60 mL/min (ref >60)
Glucose, Bld: 90 mg/dL (ref 70–99)
Potassium: 4.1 mmol/L (ref 3.5–5.1)
Sodium: 133 mmol/L — ABNORMAL LOW (ref 135–145)
Total Bilirubin: 0.6 mg/dL (ref 0.3–1.2)
Total Protein: 6.8 g/dL (ref 6.5–8.1)

## 2021-02-08 LAB — RESP PANEL BY RT-PCR (FLU A&B, COVID) ARPGX2
Influenza A by PCR: NEGATIVE
Influenza B by PCR: NEGATIVE
SARS Coronavirus 2 by RT PCR: NEGATIVE

## 2021-02-08 LAB — URINALYSIS, ROUTINE W REFLEX MICROSCOPIC
Bilirubin Urine: NEGATIVE
Glucose, UA: NEGATIVE mg/dL
Ketones, ur: NEGATIVE mg/dL
Nitrite: NEGATIVE
Protein, ur: NEGATIVE mg/dL
Specific Gravity, Urine: 1.011 (ref 1.005–1.030)
pH: 6 (ref 5.0–8.0)

## 2021-02-08 LAB — PROTEIN / CREATININE RATIO, URINE
Creatinine, Urine: 86.26 mg/dL
Protein Creatinine Ratio: 0.36 mg/mg{Cre} — ABNORMAL HIGH (ref 0.00–0.15)
Total Protein, Urine: 31 mg/dL

## 2021-02-08 LAB — RPR: RPR Ser Ql: NONREACTIVE

## 2021-02-08 SURGERY — Surgical Case
Anesthesia: Epidural

## 2021-02-08 MED ORDER — ACETAMINOPHEN 325 MG PO TABS: 650.0000 mg | ORAL_TABLET | ORAL | Status: DC | PRN

## 2021-02-08 MED ORDER — FENTANYL-BUPIVACAINE-NACL 0.5-0.125-0.9 MG/250ML-% EP SOLN
12.0000 mL/h | EPIDURAL | Status: DC | PRN
Start: 2021-02-08 — End: 2021-02-08
  Administered 2021-02-08: 12 mL/h via EPIDURAL

## 2021-02-08 MED ORDER — LACTATED RINGERS IV SOLN: 500.0000 mL | Freq: Once | INTRAVENOUS | Status: DC

## 2021-02-08 MED ORDER — MENTHOL 3 MG MT LOZG: 1.0000 | LOZENGE | OROMUCOSAL | Status: DC | PRN

## 2021-02-08 MED ORDER — OXYTOCIN-SODIUM CHLORIDE 30-0.9 UT/500ML-% IV SOLN: 2.5000 [IU]/h | INTRAVENOUS | Status: AC

## 2021-02-08 MED ORDER — ACETAMINOPHEN 500 MG PO TABS
1000.0000 mg | ORAL_TABLET | Freq: Four times a day (QID) | ORAL | Status: DC
  Administered 2021-02-08 – 2021-02-09 (×5): 1000 mg via ORAL
  Filled 2021-02-08 (×7): qty 2

## 2021-02-08 MED ORDER — FLEET ENEMA 7-19 GM/118ML RE ENEM
1.0000 | ENEMA | RECTAL | Status: DC | PRN
Start: 2021-02-08 — End: 2021-02-08

## 2021-02-08 MED ORDER — LACTATED RINGERS IV SOLN
INTRAVENOUS | Status: DC | PRN
Start: 2021-02-08 — End: 2021-02-08

## 2021-02-08 MED ORDER — ONDANSETRON HCL 4 MG/2ML IJ SOLN
4.0000 mg | Freq: Four times a day (QID) | INTRAMUSCULAR | Status: DC | PRN
Start: 2021-02-08 — End: 2021-02-08
  Administered 2021-02-08: 4 mg via INTRAVENOUS
  Filled 2021-02-08: qty 2

## 2021-02-08 MED ORDER — SENNOSIDES-DOCUSATE SODIUM 8.6-50 MG PO TABS
2.0000 | ORAL_TABLET | ORAL | Status: DC
  Administered 2021-02-08 – 2021-02-09 (×2): 2 via ORAL
  Filled 2021-02-08 (×2): qty 2

## 2021-02-08 MED ORDER — SODIUM CHLORIDE 0.9 % IV SOLN
INTRAVENOUS | Status: AC
  Filled 2021-02-08: qty 500

## 2021-02-08 MED ORDER — DIPHENHYDRAMINE HCL 50 MG/ML IJ SOLN: 12.5000 mg | INTRAMUSCULAR | Status: DC | PRN

## 2021-02-08 MED ORDER — NALBUPHINE HCL 10 MG/ML IJ SOLN: 5.0000 mg | Freq: Once | INTRAMUSCULAR | Status: DC | PRN

## 2021-02-08 MED ORDER — LACTATED RINGERS AMNIOINFUSION
INTRAVENOUS | Status: DC
  Administered 2021-02-08: 100 mL via INTRAUTERINE

## 2021-02-08 MED ORDER — TERBUTALINE SULFATE 1 MG/ML IJ SOLN: 0.2500 mg | Freq: Once | INTRAMUSCULAR | Status: AC

## 2021-02-08 MED ORDER — DIPHENHYDRAMINE HCL 25 MG PO CAPS
25.0000 mg | ORAL_CAPSULE | Freq: Four times a day (QID) | ORAL | Status: DC | PRN
  Administered 2021-02-09: 25 mg via ORAL

## 2021-02-08 MED ORDER — NALBUPHINE HCL 10 MG/ML IJ SOLN
5.0000 mg | INTRAMUSCULAR | Status: DC | PRN
Start: 2021-02-08 — End: 2021-02-10

## 2021-02-08 MED ORDER — EPHEDRINE 5 MG/ML INJ: 10.0000 mg | INTRAVENOUS | Status: DC | PRN

## 2021-02-08 MED ORDER — OXYCODONE-ACETAMINOPHEN 5-325 MG PO TABS
1.0000 | ORAL_TABLET | ORAL | Status: DC | PRN
  Administered 2021-02-09: 1 via ORAL
  Administered 2021-02-09 – 2021-02-10 (×2): 2 via ORAL
  Filled 2021-02-08: qty 1
  Filled 2021-02-08 (×2): qty 2

## 2021-02-08 MED ORDER — FENTANYL CITRATE (PF) 100 MCG/2ML IJ SOLN
25.0000 ug | INTRAMUSCULAR | Status: DC | PRN
  Administered 2021-02-08: 25 ug via INTRAVENOUS
  Administered 2021-02-08: 50 ug via INTRAVENOUS
  Administered 2021-02-08: 25 ug via INTRAVENOUS

## 2021-02-08 MED ORDER — SODIUM CHLORIDE 0.9 % IR SOLN
Status: DC | PRN
  Administered 2021-02-08: 1

## 2021-02-08 MED ORDER — FENTANYL-BUPIVACAINE-NACL 0.5-0.125-0.9 MG/250ML-% EP SOLN
EPIDURAL | Status: AC
  Filled 2021-02-08: qty 250

## 2021-02-08 MED ORDER — LACTATED RINGERS IV SOLN: INTRAVENOUS | Status: DC

## 2021-02-08 MED ORDER — DEXTROSE 5 % IV SOLN
INTRAVENOUS | Status: DC | PRN
  Administered 2021-02-08: 3 g via INTRAVENOUS

## 2021-02-08 MED ORDER — SIMETHICONE 80 MG PO CHEW
80.0000 mg | CHEWABLE_TABLET | ORAL | Status: DC | PRN
  Administered 2021-02-10: 80 mg via ORAL
  Filled 2021-02-08: qty 1

## 2021-02-08 MED ORDER — DIBUCAINE (PERIANAL) 1 % EX OINT: 1.0000 "application " | TOPICAL_OINTMENT | CUTANEOUS | Status: DC | PRN

## 2021-02-08 MED ORDER — WITCH HAZEL-GLYCERIN EX PADS: 1.0000 "application " | MEDICATED_PAD | CUTANEOUS | Status: DC | PRN

## 2021-02-08 MED ORDER — LACTATED RINGERS IV SOLN
500.0000 mL | INTRAVENOUS | Status: DC | PRN
  Administered 2021-02-08: 500 mL via INTRAVENOUS

## 2021-02-08 MED ORDER — DIPHENHYDRAMINE HCL 25 MG PO CAPS
25.0000 mg | ORAL_CAPSULE | ORAL | Status: DC | PRN
  Administered 2021-02-09: 25 mg via ORAL
  Filled 2021-02-08 (×2): qty 1

## 2021-02-08 MED ORDER — ACETAMINOPHEN 10 MG/ML IV SOLN
1000.0000 mg | Freq: Once | INTRAVENOUS | Status: DC | PRN
  Administered 2021-02-08: 1000 mg via INTRAVENOUS

## 2021-02-08 MED ORDER — ACETAMINOPHEN 500 MG PO TABS: 1000.0000 mg | ORAL_TABLET | Freq: Four times a day (QID) | ORAL | Status: DC

## 2021-02-08 MED ORDER — STERILE WATER FOR IRRIGATION IR SOLN
Status: DC | PRN
  Administered 2021-02-08: 1

## 2021-02-08 MED ORDER — KETOROLAC TROMETHAMINE 30 MG/ML IJ SOLN
30.0000 mg | Freq: Once | INTRAMUSCULAR | Status: AC | PRN
Start: 2021-02-08 — End: 2021-02-08
  Administered 2021-02-08: 30 mg via INTRAVENOUS

## 2021-02-08 MED ORDER — OXYTOCIN-SODIUM CHLORIDE 30-0.9 UT/500ML-% IV SOLN
INTRAVENOUS | Status: AC
  Filled 2021-02-08: qty 500

## 2021-02-08 MED ORDER — ONDANSETRON HCL 4 MG/2ML IJ SOLN: 4.0000 mg | Freq: Three times a day (TID) | INTRAMUSCULAR | Status: DC | PRN

## 2021-02-08 MED ORDER — KETOROLAC TROMETHAMINE 30 MG/ML IJ SOLN
30.0000 mg | Freq: Four times a day (QID) | INTRAMUSCULAR | Status: AC | PRN
Start: 2021-02-08 — End: 2021-02-09

## 2021-02-08 MED ORDER — OXYTOCIN-SODIUM CHLORIDE 30-0.9 UT/500ML-% IV SOLN
INTRAVENOUS | Status: DC | PRN
  Administered 2021-02-08: 30 [IU] via INTRAVENOUS

## 2021-02-08 MED ORDER — ONDANSETRON HCL 4 MG/2ML IJ SOLN
INTRAMUSCULAR | Status: DC | PRN
  Administered 2021-02-08: 4 mg via INTRAVENOUS

## 2021-02-08 MED ORDER — NALOXONE HCL 4 MG/10ML IJ SOLN
1.0000 ug/kg/h | INTRAVENOUS | Status: DC | PRN
  Filled 2021-02-08: qty 5

## 2021-02-08 MED ORDER — SOD CITRATE-CITRIC ACID 500-334 MG/5ML PO SOLN: 30.0000 mL | ORAL | Status: DC | PRN

## 2021-02-08 MED ORDER — TETANUS-DIPHTH-ACELL PERTUSSIS 5-2.5-18.5 LF-MCG/0.5 IM SUSY: 0.5000 mL | PREFILLED_SYRINGE | Freq: Once | INTRAMUSCULAR | Status: DC

## 2021-02-08 MED ORDER — IBUPROFEN 600 MG PO TABS
600.0000 mg | ORAL_TABLET | Freq: Four times a day (QID) | ORAL | Status: DC | PRN
  Administered 2021-02-09 – 2021-02-10 (×4): 600 mg via ORAL
  Filled 2021-02-08 (×4): qty 1

## 2021-02-08 MED ORDER — OXYTOCIN BOLUS FROM INFUSION
333.0000 mL | Freq: Once | INTRAVENOUS | Status: DC
Start: 2021-02-08 — End: 2021-02-08

## 2021-02-08 MED ORDER — KETOROLAC TROMETHAMINE 30 MG/ML IJ SOLN
INTRAMUSCULAR | Status: AC
  Filled 2021-02-08: qty 1

## 2021-02-08 MED ORDER — COCONUT OIL OIL: 1.0000 "application " | TOPICAL_OIL | Status: DC | PRN

## 2021-02-08 MED ORDER — FENTANYL CITRATE (PF) 100 MCG/2ML IJ SOLN
INTRAMUSCULAR | Status: AC
  Filled 2021-02-08: qty 2

## 2021-02-08 MED ORDER — LIDOCAINE-EPINEPHRINE (PF) 2 %-1:200000 IJ SOLN
INTRAMUSCULAR | Status: DC | PRN
  Administered 2021-02-08: 7 mL via EPIDURAL
  Administered 2021-02-08: 5 mL via EPIDURAL
  Administered 2021-02-08: 3 mL via EPIDURAL

## 2021-02-08 MED ORDER — PHENYLEPHRINE 40 MCG/ML (10ML) SYRINGE FOR IV PUSH (FOR BLOOD PRESSURE SUPPORT)
80.0000 ug | PREFILLED_SYRINGE | INTRAVENOUS | Status: DC | PRN
Start: 2021-02-08 — End: 2021-02-08
  Filled 2021-02-08: qty 10

## 2021-02-08 MED ORDER — NALOXONE HCL 0.4 MG/ML IJ SOLN: 0.4000 mg | INTRAMUSCULAR | Status: DC | PRN

## 2021-02-08 MED ORDER — SCOPOLAMINE 1 MG/3DAYS TD PT72
1.0000 | MEDICATED_PATCH | Freq: Once | TRANSDERMAL | Status: DC
  Administered 2021-02-08: 1.5 mg via TRANSDERMAL

## 2021-02-08 MED ORDER — LIDOCAINE HCL (PF) 1 % IJ SOLN: 30.0000 mL | INTRAMUSCULAR | Status: DC | PRN

## 2021-02-08 MED ORDER — ZOLPIDEM TARTRATE 5 MG PO TABS: 5.0000 mg | ORAL_TABLET | Freq: Every evening | ORAL | Status: DC | PRN

## 2021-02-08 MED ORDER — KETOROLAC TROMETHAMINE 30 MG/ML IJ SOLN: 30.0000 mg | Freq: Four times a day (QID) | INTRAMUSCULAR | Status: AC | PRN

## 2021-02-08 MED ORDER — SODIUM CHLORIDE 0.9% FLUSH: 3.0000 mL | INTRAVENOUS | Status: DC | PRN

## 2021-02-08 MED ORDER — TERBUTALINE SULFATE 1 MG/ML IJ SOLN
INTRAMUSCULAR | Status: AC
  Administered 2021-02-08: 0.25 mg via SUBCUTANEOUS
  Filled 2021-02-08: qty 1

## 2021-02-08 MED ORDER — OXYTOCIN-SODIUM CHLORIDE 30-0.9 UT/500ML-% IV SOLN: 2.5000 [IU]/h | INTRAVENOUS | Status: DC

## 2021-02-08 MED ORDER — SCOPOLAMINE 1 MG/3DAYS TD PT72
MEDICATED_PATCH | TRANSDERMAL | Status: AC
  Filled 2021-02-08: qty 1

## 2021-02-08 MED ORDER — ACETAMINOPHEN 10 MG/ML IV SOLN
INTRAVENOUS | Status: AC
  Filled 2021-02-08: qty 100

## 2021-02-08 MED ORDER — MORPHINE SULFATE (PF) 0.5 MG/ML IJ SOLN
INTRAMUSCULAR | Status: DC | PRN
  Administered 2021-02-08 (×3): 1 mg via EPIDURAL

## 2021-02-08 MED ORDER — SODIUM CHLORIDE 0.9 % IV SOLN
INTRAVENOUS | Status: DC | PRN
  Administered 2021-02-08: 500 mg via INTRAVENOUS

## 2021-02-08 MED ORDER — ENOXAPARIN SODIUM 60 MG/0.6ML IJ SOSY
60.0000 mg | PREFILLED_SYRINGE | INTRAMUSCULAR | Status: DC
  Administered 2021-02-09 – 2021-02-10 (×2): 60 mg via SUBCUTANEOUS
  Filled 2021-02-08 (×3): qty 0.6

## 2021-02-08 MED ORDER — SIMETHICONE 80 MG PO CHEW
80.0000 mg | CHEWABLE_TABLET | Freq: Three times a day (TID) | ORAL | Status: DC
  Administered 2021-02-08 – 2021-02-10 (×5): 80 mg via ORAL
  Filled 2021-02-08 (×5): qty 1

## 2021-02-08 MED ORDER — TRANEXAMIC ACID-NACL 1000-0.7 MG/100ML-% IV SOLN
INTRAVENOUS | Status: DC | PRN
  Administered 2021-02-08: 1000 mg via INTRAVENOUS

## 2021-02-08 MED ORDER — LACTATED RINGERS IV SOLN
INTRAVENOUS | Status: DC
Start: 2021-02-08 — End: 2021-02-08

## 2021-02-08 MED ORDER — PRENATAL MULTIVITAMIN CH
1.0000 | ORAL_TABLET | Freq: Every day | ORAL | Status: DC
  Administered 2021-02-09: 1 via ORAL
  Filled 2021-02-08: qty 1

## 2021-02-08 MED ORDER — FENTANYL CITRATE (PF) 100 MCG/2ML IJ SOLN
100.0000 ug | INTRAMUSCULAR | Status: DC | PRN
  Administered 2021-02-08: 100 ug via INTRAVENOUS
  Filled 2021-02-08: qty 2

## 2021-02-08 MED ORDER — PHENYLEPHRINE 40 MCG/ML (10ML) SYRINGE FOR IV PUSH (FOR BLOOD PRESSURE SUPPORT)
80.0000 ug | PREFILLED_SYRINGE | INTRAVENOUS | Status: DC | PRN
  Administered 2021-02-08: 80 ug via INTRAVENOUS

## 2021-02-08 SURGICAL SUPPLY — 36 items
APL SKNCLS STERI-STRIP NONHPOA (GAUZE/BANDAGES/DRESSINGS) ×1
BENZOIN TINCTURE PRP APPL 2/3 (GAUZE/BANDAGES/DRESSINGS) ×2 IMPLANT
CHLORAPREP W/TINT 26ML (MISCELLANEOUS) ×2 IMPLANT
CLAMP CORD UMBIL (MISCELLANEOUS) IMPLANT
CLOTH BEACON ORANGE TIMEOUT ST (SAFETY) ×2 IMPLANT
DRSG OPSITE POSTOP 4X10 (GAUZE/BANDAGES/DRESSINGS) ×2 IMPLANT
ELECT REM PT RETURN 9FT ADLT (ELECTROSURGICAL) ×2
ELECTRODE REM PT RTRN 9FT ADLT (ELECTROSURGICAL) ×1 IMPLANT
EXTRACTOR VACUUM M CUP 4 TUBE (SUCTIONS) IMPLANT
GLOVE BIOGEL PI IND STRL 7.0 (GLOVE) ×2 IMPLANT
GLOVE BIOGEL PI IND STRL 7.5 (GLOVE) ×2 IMPLANT
GLOVE BIOGEL PI INDICATOR 7.0 (GLOVE) ×2
GLOVE BIOGEL PI INDICATOR 7.5 (GLOVE) ×2
GLOVE ECLIPSE 7.5 STRL STRAW (GLOVE) ×2 IMPLANT
GOWN STRL REUS W/TWL LRG LVL3 (GOWN DISPOSABLE) ×6 IMPLANT
KIT ABG SYR 3ML LUER SLIP (SYRINGE) IMPLANT
NDL HYPO 25X5/8 SAFETYGLIDE (NEEDLE) IMPLANT
NEEDLE HYPO 25X5/8 SAFETYGLIDE (NEEDLE) IMPLANT
NS IRRIG 1000ML POUR BTL (IV SOLUTION) ×2 IMPLANT
PACK C SECTION WH (CUSTOM PROCEDURE TRAY) ×2 IMPLANT
PAD OB MATERNITY 4.3X12.25 (PERSONAL CARE ITEMS) ×2 IMPLANT
PENCIL SMOKE EVAC W/HOLSTER (ELECTROSURGICAL) ×2 IMPLANT
RTRCTR C-SECT PINK 25CM LRG (MISCELLANEOUS) ×2 IMPLANT
STRIP CLOSURE SKIN 1/2X4 (GAUZE/BANDAGES/DRESSINGS) ×2 IMPLANT
SUT PLAIN 2 0 XLH (SUTURE) ×1 IMPLANT
SUT VIC AB 0 CT1 27 (SUTURE) ×2
SUT VIC AB 0 CT1 27XBRD ANBCTR (SUTURE) IMPLANT
SUT VIC AB 0 CT1 36 (SUTURE) ×2 IMPLANT
SUT VIC AB 0 CTX 36 (SUTURE) ×4
SUT VIC AB 0 CTX36XBRD ANBCTRL (SUTURE) ×2 IMPLANT
SUT VIC AB 2-0 CT1 27 (SUTURE) ×2
SUT VIC AB 2-0 CT1 TAPERPNT 27 (SUTURE) ×1 IMPLANT
SUT VIC AB 4-0 KS 27 (SUTURE) ×2 IMPLANT
TOWEL OR 17X24 6PK STRL BLUE (TOWEL DISPOSABLE) ×2 IMPLANT
TRAY FOLEY W/BAG SLVR 14FR LF (SET/KITS/TRAYS/PACK) ×2 IMPLANT
WATER STERILE IRR 1000ML POUR (IV SOLUTION) ×2 IMPLANT

## 2021-02-08 NOTE — H&P (Addendum)
OBSTETRIC ADMISSION HISTORY AND PHYSICAL  Jacqueline Haas is a 35 y.o. female [phone] with IUP at [redacted]w[redacted]d by LMP presenting for contractions. They got worse around 10pm and got closer together so she came to the hospital. She reports +FMs, No LOF, no VB, no blurry vision, headaches or peripheral edema, and RUQ pain.  She plans on breast feeding. She request Nexplanon for birth control. She received her prenatal care at  [facility]   She would like to walk around as much as possible and see if she would need an epidural  Dating: By LMP --->  Estimated Date of Delivery: 02/08/21  Sono:    @[redacted]w[redacted]d , CWD, normal anatomy, cephalic presentation, anterior placenta, 3313g, 88% EFW   Prenatal History/Complications:  -Hx Low transverse cesarean section - maternal obesity - Desire for TOLAC  Past Medical History: Past Medical History:  Diagnosis Date   UTI (urinary tract infection)     Past Surgical History: Past Surgical History:  Procedure Laterality Date   CESAREAN SECTION     TONSILLECTOMY      Obstetrical History: OB History     Gravida  4   Para  2   Term  2   Preterm  0   AB  1   Living  2      SAB  0   IAB  1   Ectopic  0   Multiple      Live Births  2           Social History Social History   Socioeconomic History   Marital status: Single    Spouse name: Not on file   Number of children: Not on file   Years of education: Not on file   Highest education level: Not on file  Occupational History   Not on file  Tobacco Use   Smoking status: Former    Types: E-cigarettes    Quit date: 01/30/2015    Years since quitting: 6.0   Smokeless tobacco: Never   Tobacco comments:    Hookah  Vaping Use   Vaping Use: Never used  Substance and Sexual Activity   Alcohol use: Not Currently    Comment: last adult beverage 22 june 20   Drug use: No   Sexual activity: Yes    Comment: last sex 15 July 20  Other Topics Concern   Not on file  Social History  Narrative   ** Merged History Encounter **       Social Determinants of Health   Financial Resource Strain: Not on file  Food Insecurity: Not on file  Transportation Needs: Not on file  Physical Activity: Not on file  Stress: Not on file  Social Connections: Not on file    Family History: History reviewed. No pertinent family history.  Allergies: Allergies  Allergen Reactions   Morphine And Related Hives   Morphine And Related     hives    Medications Prior to Admission  Medication Sig Dispense Refill Last Dose   aspirin EC 81 MG tablet Take 1 tablet (81 mg total) by mouth daily. Swallow whole. 30 tablet 11 02/07/2021   Prenatal Vit-Fe Phos-FA-Omega (VITAFOL GUMMIES) 3.33-0.333-34.8 MG CHEW Chew 1 tablet by mouth daily. 90 tablet 12 02/07/2021   terconazole (TERAZOL 3) 0.8 % vaginal cream Place 1 applicator vaginally at bedtime. Apply nightly for three nights. 20 g 0      Review of Systems   All systems reviewed and negative except as stated in  HPI  Blood pressure (!) 143/79, pulse 89, height 5\' 8"  (1.727 m), weight 135.6 kg, last menstrual period 05/04/2020, unknown if currently breastfeeding. General appearance: alert and cooperative Lungs: clear to auscultation bilaterally Heart: regular rate and rhythm Abdomen: soft, non-tender; bowel sounds normal Pelvic: 4/60/-3 per RN Extremities: Homans sign is negative, no sign of DVT Presentation: cephalic Fetal monitoringBaseline: 130 bpm, Variability: Good {> 6 bpm), Accelerations: Reactive, and Decelerations: Early Uterine activityq2-5 min Dilation: 4 Effacement (%): 60, 70 Station: -3 Exam by:: K   Prenatal labs: ABO, Rh: O/Positive/-- (01/19 1556) Antibody: Negative (01/19 1556) Rubella: 1.24 (01/19 1556) RPR: Non Reactive (04/28 0952)  HBsAg: Negative (01/19 1556)  HIV: Non Reactive (04/28 0952)  GBS: Negative/-- (06/29 1145)  2 hr Glucola: 82, 119, 73 Genetic screening:  Low risk Anatomy 10-16-1982:  normal  Prenatal Transfer Tool  Maternal Diabetes: No Genetic Screening: Normal Maternal Ultrasounds/Referrals: Normal Fetal Ultrasounds or other Referrals:  Referred to Materal Fetal Medicine  Maternal Substance Abuse:  No Significant Maternal Medications:  None Significant Maternal Lab Results: Group B Strep negative  No results found for this or any previous visit (from the past 24 hour(s)).  Patient Active Problem List   Diagnosis Date Noted   BMI 45.0-49.9, adult (HCC) 02/08/2021   History of cesarean section 08/03/2020   Supervision of other normal pregnancy, antepartum 08/03/2020   Obesity in pregnancy, antepartum 08/03/2020    Assessment/Plan:  Jacqueline Haas is a 35 y.o. 34 at [redacted]w[redacted]d here for Early labor  #Labor:Progressing well, continue to monitor #Pain: IV pain medicine, Epidural as desired #FWB: Cat 1 #ID:  GBS Negative #MOF: Breast #MOC:Nexplanon #Circ:  yes  [redacted]w[redacted]d, MD  02/08/2021, 3:00 AM  Attestation:  I confirm that I have verified the information documented in the resident's note and that I have also personally reperformed the physical exam and all medical decision making activities.   The patient was seen and examined by me also Agree with note NST reactive and reassuring UCs as listed Cervical exams as listed in note Admit and observe for labor  02/10/2021, CNM

## 2021-02-08 NOTE — Op Note (Signed)
Jacqueline Haas PROCEDURE DATE: 02/08/2021  PREOPERATIVE DIAGNOSES: Intrauterine pregnancy at [redacted]w[redacted]d weeks gestation; non-reassuring fetal status and elective repeat cesarean  POSTOPERATIVE DIAGNOSES: The same  PROCEDURE: Repeat Low Transverse Cesarean Section  SURGEON:  Dr. Shonna Chock  ASSISTANT:  Dr. Mart Piggs  ANESTHESIOLOGY TEAM: Anesthesiologist: Elmer Picker, MD  INDICATIONS: Jacqueline Haas is a 35 y.o. [phone] at [redacted]w[redacted]d here for cesarean section secondary to the indications listed under preoperative diagnoses; please see preoperative note for further details.  The risks of cesarean section were discussed with the patient including but were not limited to: bleeding which may require transfusion or reoperation; infection which may require antibiotics; injury to bowel, bladder, ureters or other surrounding organs; injury to the fetus; need for additional procedures including hysterectomy in the event of a life-threatening hemorrhage; placental abnormalities wth subsequent pregnancies, incisional problems, thromboembolic phenomenon and other postoperative/anesthesia complications.   The patient concurred with the proposed plan, giving informed written consent for the procedure.    FINDINGS:  Viable female infant in cephalic presentation.  Apgars 8 and 9.  Thick meconium stained amniotic fluid.  Intact placenta, three vessel cord.  Normal uterus, fallopian tubes and ovaries bilaterally.  ANESTHESIA: Epidural  INTRAVENOUS FLUIDS: 800 ml   ESTIMATED BLOOD LOSS: 260 ml URINE OUTPUT:  200 ml SPECIMENS: Placenta sent to L&D COMPLICATIONS: None immediate  PROCEDURE IN DETAIL:  The patient preoperatively received intravenous antibiotics and had sequential compression devices applied to her lower extremities.  She was then taken to the operating room where the epidural anesthesia was dosed up to surgical level and was found to be adequate. She was then placed in a dorsal supine  position with a leftward tilt, and prepped and draped in a sterile manner.  A foley catheter was placed into her bladder and attached to constant gravity.  After an adequate timeout was performed, a Pfannenstiel skin incision was made with scalpel on her preexisting scar and carried through to the underlying layer of fascia. The fascia was incised in the midline, and this incision was extended bilaterally using the Mayo scissors.  Kocher clamps were applied to the superior aspect of the fascial incision and the underlying rectus muscles were dissected off bluntly. The rectus muscles were separated in the midline and the peritoneum was entered bluntly. The Alexis self-retaining retractor was introduced into the abdominal cavity.  A bladder flap was created using Metzenbaum scissors. Attention was turned to the lower uterine segment where a low transverse hysterotomy was made with a scalpel and extended bilaterally bluntly.  The infant was successfully delivered, the cord was clamped and cut after one minute, and the infant was handed over to the awaiting neonatology team. Uterine massage was then administered, and the placenta delivered intact with a three-vessel cord. The uterus was then cleared of clots and debris.  The hysterotomy was closed with 0 Vicryl in a running locked fashion, and an imbricating layer was also placed with 0 Vicryl.  Figure-of-eight 0 Vicryl serosal stitches were placed to help with hemostasis.  The pelvis was cleared of all clot and debris. Hemostasis was confirmed on all surfaces.  The retractor was removed.  The peritoneum was closed with a 0 Vicryl running stitch. The fascia was then closed using 0 Vicryl in a running fashion.  The subcutaneous layer was irrigated, reapproximated with 2-0 plain gut interrupted stitches, and the skin was closed with a 4-0 Vicryl subcuticular stitch. The patient tolerated the procedure well. Sponge, instrument and needle counts  were correct x 3.  She was  taken to the recovery room in stable condition.   Jacqueline Seton, MD OB Fellow, Faculty [facility], Center for [facility] 02/08/2021 11:36 AM

## 2021-02-08 NOTE — Discharge Summary (Signed)
Postpartum Discharge Summary     Patient Name: Jacqueline Haas DOB: 08/27/1985 MRN: [id number]  Date of admission: 02/08/2021 Delivery date:02/08/2021  Delivering provider: Laurey Arrow [place]  Date of discharge: 02/10/2021  Admitting diagnosis: BMI 45.0-49.9, adult ([address]) [Z68.42] Intrauterine pregnancy: [redacted]w[redacted]d    Secondary diagnosis:  Active Problems:   History of cesarean section   Supervision of other normal pregnancy, antepartum   Obesity in pregnancy, antepartum   BMI 45.0-49.9, adult ([facility]   Mild preeclampsia  Additional problems: Gas pains   Discharge diagnosis: Term Pregnancy Delivered and Gestational Hypertension                                              Post partum procedures: none Augmentation: N/A Complications: None  Hospital course: Onset of Labor With Unplanned C/S   35y.o. yo [id number] admitted in Latent Labor on 02/08/2021. Patient had a labor course significant for recurrent decelerations, cat 2 strip. Patient desires repeat cesarean section and desires to stop TOLAC. The patient went for cesarean section due to Elective Repeat and Non-Reassuring FHR. Delivery details as follows: Membrane Rupture Time/Date: 8:56 AM ,02/08/2021   Delivery Method:C-Section, Low Transverse  Details of operation can be found in separate operative note. Patient had an complicated postpartum course. She developed gas pains and constipation. This resolved with miralax and simethicone along with ambulation. She was ambulating,tolerating a regular diet, passing flatus, and urinating well at time of discharge.  Patient is discharged home in stable condition 02/10/21.  Newborn Data: Birth date:02/08/2021  Birth time:10:55 AM  Gender:Female  Living status:Living  Apgars:8 ,9  Weight:3310 g   Magnesium Sulfate received: No BMZ received: No Rhophylac:N/A MMR:N/A T-DaP:Given prenatally Flu: No Transfusion:No  Physical exam  Vitals:   02/09/21 0005 02/09/21 0500  02/10/21 0204 02/10/21 0638  BP: (!) 109/56 111/62 120/72 133/83  Pulse: 72 68  92  Resp: '18 18  16  ' Temp: 98 F (36.7 C) 98.3 F (36.8 C) 98.3 F (36.8 C) 98.8 F (37.1 C)  TempSrc: Oral Oral Oral Oral  SpO2: 100% 100% 99% 100%  Weight:      Height:       General: alert, cooperative, and no distress Lochia: appropriate Uterine Fundus: firm Incision: Dressing is clean, dry, and intact DVT Evaluation: No evidence of DVT seen on physical exam. Abdomen: Soft, distended, normoactive bowel sounds. No guarding or rebound Labs: Lab Results  Component Value Date   WBC 12.6 (H) 02/09/2021   HGB 9.7 (L) 02/09/2021   HCT 30.4 (L) 02/09/2021   MCV 83.7 02/09/2021   PLT 260 02/09/2021   CMP Latest Ref Rng & Units 02/08/2021  Glucose 70 - 99 mg/dL 90  BUN 6 - 20 mg/dL 6  Creatinine 0.44 - 1.00 mg/dL 0.64  Sodium 135 - 145 mmol/L 133(L)  Potassium 3.5 - 5.1 mmol/L 4.1  Chloride 98 - 111 mmol/L 103  CO2 22 - 32 mmol/L 21(L)  Calcium 8.9 - 10.3 mg/dL 9.1  Total Protein 6.5 - 8.1 g/dL 6.8  Total Bilirubin 0.3 - 1.2 mg/dL 0.6  Alkaline Phos 38 - 126 U/L 118  AST 15 - 41 U/L 25  ALT 0 - 44 U/L 18   Edinburgh Score: Edinburgh Postnatal Depression Scale Screening Tool 02/09/2021  I have been able to laugh and see the funny side  of things. 0  I have looked forward with enjoyment to things. 0  I have blamed myself unnecessarily when things went wrong. 1  I have been anxious or worried for no good reason. 1  I have felt scared or panicky for no good reason. 1  Things have been getting on top of me. 1  I have been so unhappy that I have had difficulty sleeping. 0  I have felt sad or miserable. 1  I have been so unhappy that I have been crying. 1  The thought of harming myself has occurred to me. 0  Edinburgh Postnatal Depression Scale Total 6     After visit meds:  Allergies as of 02/10/2021       Reactions   Morphine And Related Hives   Morphine And Related    hives         Medication List     STOP taking these medications    aspirin EC 81 MG tablet   terconazole 0.8 % vaginal cream Commonly known as: TERAZOL 3       TAKE these medications    ferrous sulfate 325 (65 FE) MG tablet Take 1 tablet (325 mg total) by mouth 2 (two) times daily with a meal.   ibuprofen 600 MG tablet Commonly known as: ADVIL Take 1 tablet (600 mg total) by mouth every 6 (six) hours as needed for fever or headache.   oxyCODONE-acetaminophen 5-325 MG tablet Commonly known as: PERCOCET/ROXICET Take 1-2 tablets by mouth every 4 (four) hours as needed for moderate pain.   polyethylene glycol 17 g packet Commonly known as: MIRALAX / GLYCOLAX Take 17 g by mouth daily. Start taking on: February 11, 2021   senna-docusate 8.6-50 MG tablet Commonly known as: Senokot-S Take 2 tablets by mouth daily.   simethicone 80 MG chewable tablet Commonly known as: MYLICON Chew 1 tablet (80 mg total) by mouth as needed for flatulence.   Vitafol Gummies 3.33-0.333-34.8 MG Chew Chew 1 tablet by mouth daily.         Discharge home in stable condition Infant Feeding: Breast Infant Disposition:home with mother Discharge instruction: per After Visit Summary and Postpartum booklet. Activity: Advance as tolerated. Pelvic rest for 6 weeks.  Diet: routine diet Future Appointments: Future Appointments  Date Time Provider [place]  02/16/2021 10:20 AM [place] None  03/09/2021 10:15 AM Shelly Bombard, MD [place] None   Follow up Visit: Message sent to [facility] 02/03/21 by Sylvester Harder.   Please schedule this patient for a In person postpartum visit in 6 weeks with the following provider: Any provider. Additional Postpartum F/U:BP check 1 week  High risk pregnancy complicated by: HTN Delivery mode:  C-Section, Low Transverse  Anticipated Birth Control:  Nexplanon, to be placed at postpartum visit.   Mallie Snooks, MSN, CNM Certified Nurse Midwife, [employer] for [employer], [facility] 02/10/21 7:30 AM

## 2021-02-08 NOTE — Anesthesia Preprocedure Evaluation (Signed)
Anesthesia Evaluation  Patient identified by MRN, date of birth, ID band Patient awake    Reviewed: Allergy & Precautions, NPO status , Patient's Chart, lab work & pertinent test results  Airway Mallampati: III  TM Distance: >3 FB Neck ROM: Full    Dental no notable dental hx.    Pulmonary neg pulmonary ROS, former smoker,    Pulmonary exam normal breath sounds clear to auscultation       Cardiovascular negative cardio ROS Normal cardiovascular exam Rhythm:Regular Rate:Normal     Neuro/Psych negative neurological ROS  negative psych ROS   GI/Hepatic negative GI ROS, Neg liver ROS,   Endo/Other  Morbid obesity (BMI 46)  Renal/GU negative Renal ROS  negative genitourinary   Musculoskeletal negative musculoskeletal ROS (+)   Abdominal   Peds  Hematology negative hematology ROS (+)   Anesthesia Other Findings TOLAC  Reproductive/Obstetrics (+) Pregnancy                             Anesthesia Physical Anesthesia Plan  ASA: 3  Anesthesia Plan: Epidural   Post-op Pain Management:    Induction:   PONV Risk Score and Plan: Treatment may vary due to age or medical condition  Airway Management Planned: Natural Airway  Additional Equipment:   Intra-op Plan:   Post-operative Plan:   Informed Consent: I have reviewed the patients History and Physical, chart, labs and discussed the procedure including the risks, benefits and alternatives for the proposed anesthesia with the patient or authorized representative who has indicated his/her understanding and acceptance.       Plan Discussed with: Anesthesiologist  Anesthesia Plan Comments: (Patient identified. Risks, benefits, options discussed with patient including but not limited to bleeding, infection, nerve damage, paralysis, failed block, incomplete pain control, headache, blood pressure changes, nausea, vomiting, reactions to  medication, itching, and post partum back pain. Confirmed with bedside nurse the patient's most recent platelet count. Confirmed with the patient that they are not taking any anticoagulation, have any bleeding history or any family history of bleeding disorders. Patient expressed understanding and wishes to proceed. All questions were answered. )        Anesthesia Quick Evaluation

## 2021-02-08 NOTE — Progress Notes (Signed)
Patient is a 35 yo g4p2012 @ 40+0, here with SOL, found to have mild gestational hypertension, pregnancy also complicated by obesity, history of c/s x1 for fetal indications, hx first baby nsvd. Has had recurrent variables/lates and 2 prolonged decelerations. 6 cm dilated. Thick meconium. Patient very concerned about baby and wants to proceed with repeat cesarean section.  The risks of cesarean section were discussed with the patient including but were not limited to: bleeding which may require transfusion or reoperation; infection which may require antibiotics; injury to bowel, bladder, ureters or other surrounding organs; injury to the fetus; need for additional procedures including hysterectomy in the event of a life-threatening hemorrhage; placental abnormalities wth subsequent pregnancies, incisional problems, thromboembolic phenomenon and other postoperative/anesthesia complications.    The patient concurred with the proposed plan, giving informed written consent for the procedures.  Anesthesia and OR aware.  Preoperative prophylactic antibiotics and SCDs ordered on call to the OR.  To OR when ready.

## 2021-02-08 NOTE — Progress Notes (Signed)
Jacqueline Haas is a 35 y.o. [phone] at [redacted]w[redacted]d  admitted for active labor  Subjective:   Objective: BP (!) 130/111   Pulse (!) 101   Temp 98.3 F (36.8 C) (Oral)   Resp 19   Ht 5\' 8"  (1.727 m)   Wt 135.6 kg   LMP 05/04/2020   SpO2 100%   BMI 45.46 kg/m  No intake/output data recorded. No intake/output data recorded.  FHT:  FHR: 140 bpm, variability: moderate,  accelerations:  Present,  decelerations:  Present Patient had a prolonged deceleration down to nadir of 60bpm x approx 4 mins. Slow return to baseline. IUPC and IFSE placed UC:   regular, every 5 minutes SVE:   Dilation: 6 Effacement (%): 100 Station: -3 Exam by:: [email or web address], RN  I checked behind RN to place IUPC and FSE. Patient had SROM during first exam with this moderate meconium   Labs: Lab Results  Component Value Date   WBC 15.6 (H) 02/08/2021   HGB 11.3 (L) 02/08/2021   HCT 34.9 (L) 02/08/2021   MCV 81.7 02/08/2021   PLT 328 02/08/2021    Assessment / Plan: Spontaneous labor, progressing normally  Labor: Progressing normally Preeclampsia:   NA Fetal Wellbeing:  Category II Pain Control:  Epidural I/D:  n/a Anticipated MOD:   guarded for NSVD. Will update the attending  02/10/2021 DNP, CNM  02/08/21  10:02 AM

## 2021-02-08 NOTE — Anesthesia Procedure Notes (Signed)
Epidural Patient location during procedure: OB Start time: 02/08/2021 7:15 AM End time: 02/08/2021 7:25 AM  Staffing Anesthesiologist: Elmer Picker, MD Performed: anesthesiologist   Preanesthetic Checklist Completed: patient identified, IV checked, risks and benefits discussed, monitors and equipment checked, pre-op evaluation and timeout performed  Epidural Patient position: sitting Prep: DuraPrep and site prepped and draped Patient monitoring: continuous pulse ox, blood pressure, heart rate and cardiac monitor Approach: midline Location: L3-L4 Injection technique: LOR air  Needle:  Needle type: Tuohy  Needle gauge: 17 G Needle length: 9 cm Needle insertion depth: 8 cm Catheter type: closed end flexible Catheter size: 19 Gauge Catheter at skin depth: 13 cm Test dose: negative  Assessment Sensory level: T8 Events: blood not aspirated, injection not painful, no injection resistance, no paresthesia and negative IV test  Additional Notes Patient identified. Risks/Benefits/Options discussed with patient including but not limited to bleeding, infection, nerve damage, paralysis, failed block, incomplete pain control, headache, blood pressure changes, nausea, vomiting, reactions to medication both or allergic, itching and postpartum back pain. Confirmed with bedside nurse the patient's most recent platelet count. Confirmed with patient that they are not currently taking any anticoagulation, have any bleeding history or any family history of bleeding disorders. Patient expressed understanding and wished to proceed. All questions were answered. Sterile technique was used throughout the entire procedure. Please see nursing notes for vital signs. Test dose was given through epidural catheter and negative prior to continuing to dose epidural or start infusion. Warning signs of high block given to the patient including shortness of breath, tingling/numbness in hands, complete motor block,  or any concerning symptoms with instructions to call for help. Patient was given instructions on fall risk and not to get out of bed. All questions and concerns addressed with instructions to call with any issues or inadequate analgesia.  Reason for block:procedure for pain

## 2021-02-08 NOTE — MAU Note (Signed)
Pt presents to MAU c/o CTX every 3-5 min increasing in intensity over the last few hours. Denies LOF, endorses +FM. States that they are planning VBAC, note from Dr. Alysia Penna confirmed in chart.

## 2021-02-08 NOTE — Progress Notes (Signed)
Labor Progress Note Jacqueline Haas is a 35 y.o. [phone] at [redacted]w[redacted]d presented for labor S:  To room for prolonged shallow deceleration that spontaneously resolved. Otherwise feeling her contractions and trying to hold off on epidural for now.  O:  BP (!) 144/76   Pulse 94   Temp 98.3 F (36.8 C) (Oral)   Resp 18   Ht 5\' 8"  (1.727 m)   Wt 135.6 kg   LMP 05/04/2020   BMI 45.46 kg/m  EFM: 130/moderate variability/+accels, Early decels and shallow prolonged decel to 110/105  CVE: Dilation: 5 Effacement (%): 80 Cervical Position: Middle Station: -3 Presentation: Vertex Exam by:: Krause   A&P: 35 y.o. 34 [redacted]w[redacted]d admitted in labor #Labor: Progressing well.  #Pain: IV pain meds PRN, epidural as desired #FWB: Cat 2 but reassuring. Prolonged decel resolved spontaneously with good variability  #GBS negative #TOLAC: consents signed  [redacted]w[redacted]d, MD 5:37 AM

## 2021-02-08 NOTE — Transfer of Care (Signed)
Immediate Anesthesia Transfer of Care Note  Patient: Jacqueline Haas  Procedure(s) Performed: CESAREAN SECTION  Patient Location: PACU  Anesthesia Type:Epidural  Level of Consciousness: awake, alert  and oriented  Airway & Oxygen Therapy: Patient Spontanous Breathing  Post-op Assessment: Report given to RN and Post -op Vital signs reviewed and stable  Post vital signs: Reviewed and stable  Last Vitals:  Vitals Value Taken Time  BP 105/68 02/08/21 1145  Temp    Pulse 96 02/08/21 1149  Resp 20 02/08/21 1149  SpO2 100 % 02/08/21 1149  Vitals shown include unvalidated device data.  Last Pain:  Vitals:   02/08/21 0743  TempSrc:   PainSc: 0-No pain         Complications: No notable events documented.

## 2021-02-08 NOTE — Lactation Note (Addendum)
This note was copied from a baby's chart. Lactation Consultation Note  Patient Name: Jacqueline Haas [id number] Date: 02/08/2021 Reason for consult: Initial assessment;Term Age:33 hours  Initial visit to 10 hours old infant of a P3 mother. Mother reports breastfeeding more than 10 years ago. Mother states infant falls asleep as soon as she attempts a latch. Mother requested some formula because infant had not fed for a while. Offered ~12mL. Talked about normal newborn behavior and what to expect during first 24 h. DEBP proactively set up by RN per mother's request.  LC offered assistance with latch. Reviewed hand expression and mother is able to teach back, easily expressed colostrum.  Discussed alignment, use of support pillows, latch using teacup hold. Breast tissue is very pliable, nipples are short.  Infant is still breastfeeding upon LC leaving.  Plan: 1-Skin to skin 2-Aim for a deep, comfortable latch 3-Breastfeeding on demand or 8-12 times in 24h period. 4-Keep infant awake during breastfeeding session: massaging breast, infant's hand/shoulder/feet 5-Monitor voids and stools as signs good intake.  6-Encouraged maternal rest, hydration and food intake.  7-Contact LC as needed for feeds/support/concerns/questions   All questions answered at this time. Provided Lactation services brochure and promoted [employer] booklet information. LC offered to fax a WIC referral.   Maternal Data Has patient been taught Hand Expression?: Yes Does the patient have breastfeeding experience prior to this delivery?: Yes How long did the patient breastfeed?: >6 months  Feeding Mother's Current Feeding Choice: Breast Milk  LATCH Score Latch: Grasps breast easily, tongue down, lips flanged, rhythmical sucking.  Audible Swallowing: Spontaneous and intermittent  Type of Nipple: Everted at rest and after stimulation (short shafted nipples, pliable tissue)  Comfort (Breast/Nipple): Soft /  non-tender  Hold (Positioning): Assistance needed to correctly position infant at breast and maintain latch.  LATCH Score: 9   Lactation Tools Discussed/Used Tools: Pump;Flanges Flange Size: 27 Breast pump type: Manual;Double-Electric Breast Pump Pump Education: Setup, frequency, and cleaning;Milk Storage Reason for Pumping: mother's request Pumping frequency: as needed  Interventions Interventions: Breast feeding basics reviewed;Assisted with latch;Skin to skin;Breast massage;Hand express;Pre-pump if needed;Adjust position;Breast compression;DEBP;Hand pump;Expressed milk;Position options;Support pillows;Education  Discharge Pump: Personal;Manual;DEBP WIC Program: No  Consult Status Consult Status: Follow-up Date: 02/09/21 Follow-up type: In-patient    Audrick Lamoureaux A Higuera Ancidey 02/08/2021, 9:41 PM

## 2021-02-08 NOTE — Anesthesia Postprocedure Evaluation (Signed)
Anesthesia Post Note  Patient: Jacqueline Haas  Procedure(s) Performed: CESAREAN SECTION     Patient location during evaluation: PACU Anesthesia Type: Epidural Level of consciousness: oriented and awake and alert Pain management: pain level controlled Vital Signs Assessment: post-procedure vital signs reviewed and stable Respiratory status: spontaneous breathing, respiratory function stable and patient connected to nasal cannula oxygen Cardiovascular status: blood pressure returned to baseline and stable Postop Assessment: no headache, no backache, no apparent nausea or vomiting and epidural receding Anesthetic complications: no   No notable events documented.  Last Vitals:  Vitals:   02/08/21 1245 02/08/21 1302  BP: (!) 122/58 (!) 119/55  Pulse: 95 92  Resp: (!) 24 17  Temp:  37.2 C  SpO2: 99% 98%    Last Pain:  Vitals:   02/08/21 1302  TempSrc: Oral  PainSc:    Pain Goal:                   [name] L [name]

## 2021-02-09 LAB — CBC
HCT: 30.4 % — ABNORMAL LOW (ref 36.0–46.0)
Hemoglobin: 9.7 g/dL — ABNORMAL LOW (ref 12.0–15.0)
MCH: 26.7 pg (ref 26.0–34.0)
MCHC: 31.9 g/dL (ref 30.0–36.0)
MCV: 83.7 fL (ref 80.0–100.0)
Platelets: 260 10*3/uL (ref 150–400)
RBC: 3.63 MIL/uL — ABNORMAL LOW (ref 3.87–5.11)
RDW: 16.9 % — ABNORMAL HIGH (ref 11.5–15.5)
WBC: 12.6 10*3/uL — ABNORMAL HIGH (ref 4.0–10.5)
nRBC: 0 % (ref 0.0–0.2)

## 2021-02-09 MED ORDER — DIPHENHYDRAMINE HCL 25 MG PO CAPS: 50.0000 mg | ORAL_CAPSULE | Freq: Four times a day (QID) | ORAL | Status: DC | PRN

## 2021-02-09 MED ORDER — FERROUS SULFATE 325 (65 FE) MG PO TABS
325.0000 mg | ORAL_TABLET | Freq: Two times a day (BID) | ORAL | Status: DC
  Administered 2021-02-09 – 2021-02-10 (×3): 325 mg via ORAL
  Filled 2021-02-09 (×3): qty 1

## 2021-02-09 NOTE — Progress Notes (Signed)
POSTPARTUM PROGRESS NOTE  POD #1  Subjective:  Jacqueline Haas is a 35 y.o. [phone] s/p rLTCS at [redacted]w[redacted]d.  She reports she doing well. No acute events overnight. She denies any problems with ambulating, or po intake. Has not voided yet but foley was just removed. Denies nausea or vomiting. She has passed flatus. Pain is moderately controlled.  Lochia is mild.  Objective: Blood pressure 111/62, pulse 68, temperature 98.3 F (36.8 C), temperature source Oral, resp. rate 18, height 5\' 8"  (1.727 m), weight 135.6 kg, last menstrual period 05/04/2020, SpO2 100 %, unknown if currently breastfeeding.  Physical Exam:  General: alert, cooperative and no distress Chest: no respiratory distress Heart:regular rate, distal pulses intact Abdomen: soft, nontender,  Uterine Fundus: firm, appropriately tender DVT Evaluation: No calf swelling or tenderness Extremities: no edema Skin: warm, dry; incision clean/dry/intact w/ honeycomb dressing in place, minimal strikethrough  Recent Labs    02/08/21 0258  HGB 11.3*  HCT 34.9*    Assessment/Plan: Jacqueline Haas is a 35 y.o. 34 s/p rLTCS at [redacted]w[redacted]d for NRFHT.  POD#1 - Doing welll; pain moderately controlled. H/H appropriate  Routine postpartum care  OOB, ambulated  Lovenox for VTE prophylaxis Anemia: asymptomatic  Start po ferrous sulfate BID Contraception: outpatient nexplanon Feeding: Breast and bottle  Dispo: Plan for discharge POD2.   LOS: 1 day   [redacted]w[redacted]d MD  02/09/2021, 6:31 AM

## 2021-02-09 NOTE — Lactation Note (Signed)
This note was copied from a baby's chart. Lactation Consultation Note  Patient Name: Boy Meriem Lemieux [id number] Date: 02/09/2021 Reason for consult: Follow-up assessment;Term Age:33 hours   P3 mother whose infant is now 53 hours old.  This is a term baby at 40+0 weeks.  Mother breast fed her first two children (now 71 and 54 years old) for 3 months each.    Baby was asleep on mother's chest when I arrived.  Mother reported feeding and supplementing with formula due to mother being concerned that he is not "getting enough." Mother questioned whether or not she needed to give formula.  Advised her that providing formula is not necessary at this time unless it is her preference.  Mother felt reassured after hearing this.  LATCH scores have been 7-9 and baby has voided/stooled. Baby also received a circumcision today and I educated mother on the possibility that he may be more sleepy due to circumcision.  Discussed possibility of cluster feeding tonight and awakening more to feed due to sleepiness today.  Mother verbalized understanding.  She will continue to feed on cue or at least 8-12 times/24 hours.  She will call her RN/LC for latch assistance as needed.    Mother will be working from home and has a DEBP for home use.  No support person present at this time.   Maternal Data Has patient been taught Hand Expression?: Yes Does the patient have breastfeeding experience prior to this delivery?: Yes How long did the patient breastfeed?: 3 months with each of her other children  Feeding Mother's Current Feeding Choice: Breast Milk  LATCH Score                    Lactation Tools Discussed/Used    Interventions Interventions: Breast feeding basics reviewed;Education  Discharge Pump: Personal  Consult Status Consult Status: Follow-up Date: 02/10/21 Follow-up type: In-patient    Donaven Criswell R Aliviya Schoeller 02/09/2021, 2:15 PM

## 2021-02-09 NOTE — Progress Notes (Signed)
Patient with itching after anethesia and pain meds for surgery. Benadryl 25mg  given at 1300. Continued complaints of intense itching.  , MD called for request additional dose of 25 mg.  OK to give. New orders placed.

## 2021-02-10 ENCOUNTER — Other Ambulatory Visit: Payer: Self-pay | Admitting: Obstetrics and Gynecology

## 2021-02-10 MED ORDER — DOCUSATE SODIUM 100 MG PO CAPS: 100.0000 mg | ORAL_CAPSULE | Freq: Two times a day (BID) | ORAL | 2 refills | Status: AC | PRN

## 2021-02-10 MED ORDER — OXYCODONE-ACETAMINOPHEN 5-325 MG PO TABS: 1.0000 | ORAL_TABLET | ORAL | 0 refills | Status: DC | PRN

## 2021-02-10 MED ORDER — POLYETHYLENE GLYCOL 3350 17 G PO PACK
17.0000 g | PACK | Freq: Every day | ORAL | Status: DC
  Administered 2021-02-10: 17 g via ORAL
  Filled 2021-02-10 (×2): qty 1

## 2021-02-10 MED ORDER — OXYCODONE HCL 5 MG PO TABS
10.0000 mg | ORAL_TABLET | Freq: Once | ORAL | Status: AC
  Administered 2021-02-10: 10 mg via ORAL
  Filled 2021-02-10: qty 2

## 2021-02-10 MED ORDER — FERROUS SULFATE 325 (65 FE) MG PO TABS: 325.0000 mg | ORAL_TABLET | Freq: Two times a day (BID) | ORAL | 0 refills | Status: AC

## 2021-02-10 MED ORDER — IBUPROFEN 600 MG PO TABS: 600.0000 mg | ORAL_TABLET | Freq: Four times a day (QID) | ORAL | 0 refills | Status: AC | PRN

## 2021-02-10 MED ORDER — POLYETHYLENE GLYCOL 3350 17 G PO PACK: 17.0000 g | PACK | Freq: Every day | ORAL | 0 refills | Status: AC

## 2021-02-10 MED ORDER — SIMETHICONE 80 MG PO CHEW: 80.0000 mg | CHEWABLE_TABLET | ORAL | 0 refills | Status: AC | PRN

## 2021-02-10 MED ORDER — SIMETHICONE 80 MG PO CHEW
80.0000 mg | CHEWABLE_TABLET | Freq: Once | ORAL | Status: AC
  Administered 2021-02-10: 80 mg via ORAL
  Filled 2021-02-10: qty 1

## 2021-02-10 MED ORDER — OXYCODONE-ACETAMINOPHEN 5-325 MG PO TABS: 1.0000 | ORAL_TABLET | Freq: Four times a day (QID) | ORAL | 0 refills | Status: AC | PRN

## 2021-02-10 MED ORDER — SENNOSIDES-DOCUSATE SODIUM 8.6-50 MG PO TABS: 2.0000 | ORAL_TABLET | ORAL | Status: AC

## 2021-02-10 NOTE — Discharge Instructions (Signed)
Remove honeycomb dressing at day 5 from surgery. Remove prior if honeycomb is wet or peeling.

## 2021-02-10 NOTE — Progress Notes (Signed)
RN rounded on patient and patient still complained of pain 8-10 on pain scale. Patient stated that the pain was in the upper portion of her stomach. RN auscultated bowel sounds and bowel sounds audible in RUQ4, LUQ, and LLQ. Faint bowel sounds heard in patients RLQ. Patient had received 1,000mg  of Tylenol and 600mg  of ibuprofen at 2359 and a simethicone at 0004. Patient was able to burp. RN contacted Dr. due to patients unrelieved pain and said that she would come and speak to patient and order Miralax. Patient has ambulated in room and in the hallway.   Dr. Dorma Russell came to see patient and said that she would order a one time does of oxy along with Miralax. She also stated to give the patient a heating pad and if that doesn't work then the patient would need an enema. RN will give prescribed medications and continue monitoring patient.

## 2021-02-10 NOTE — Progress Notes (Signed)
Called by nursing staff that patient was having continued gas pains.  Patient states she has been having gas pains since this morning and that they are sharp 8/10, intermittent and have been moving around her abdomen. She states she has not had a bowel movement in more than 3 days and that she hasn't passed gas since then either. She denies any fevers or chills or other symptoms. She states the pains do not radiate anywhere else.  On exam she appears mildly uncomfortable but engages in conversation Abd: Distended, soft abdomen. No guarding or rebound. Tympanitic to percussion  Assessment/Plan: Jacqueline Haas is a 35 y.o. [phone] s/p rLTCS at [redacted]w[redacted]d for NRFHT.   POD#2 -              Routine postpartum care             OOB, ambulated             Lovenox for VTE prophylaxis  Gas Pains -  Patient has received tylenol and ibuprofen without relief and took an as needed simethicone this evening but has not been taking the scheduled with meals one.  10mg  oxycodone once, extra dose simethicone 80mg  once, heating pad and recommended enema if no relief. No signs of surgical abdomen and patient has not yet passed gas   MD  02/10/2021, 02:35 AM

## 2021-02-10 NOTE — Progress Notes (Signed)
Patient refused Tylenol and Lovenox. Patient wanted Percocet but had received dose of oxy at 0316. Patient to receive on day shift

## 2021-02-15 ENCOUNTER — Encounter: Payer: Self-pay | Admitting: Obstetrics

## 2021-02-15 ENCOUNTER — Ambulatory Visit: Payer: No Typology Code available for payment source | Admitting: Obstetrics

## 2021-02-15 ENCOUNTER — Ambulatory Visit (INDEPENDENT_AMBULATORY_CARE_PROVIDER_SITE_OTHER): Payer: No Typology Code available for payment source | Admitting: Obstetrics

## 2021-02-15 ENCOUNTER — Other Ambulatory Visit: Payer: Self-pay

## 2021-02-15 VITALS — BP 135/84 | HR 78 | Wt 278.0 lb

## 2021-02-15 DIAGNOSIS — Z4889 Encounter for other specified surgical aftercare: Secondary | ICD-10-CM

## 2021-02-15 NOTE — Progress Notes (Signed)
   GYNECOLOGY OFFICE VISIT NOTE  History:   Jacqueline Haas is a 35 y.o. [phone] here today for postoperative wound management after C/S. She denies any abnormal vaginal discharge, bleeding, pelvic pain or other concerns.    Past Medical History:  Diagnosis Date   UTI (urinary tract infection)     Past Surgical History:  Procedure Laterality Date   CESAREAN SECTION     CESAREAN SECTION N/A 02/08/2021   Procedure: CESAREAN SECTION;  Surgeon: Kathrynn Running, MD;  Location: [facility] LD ORS;  Service: Obstetrics;  Laterality: N/A;   TONSILLECTOMY      The following portions of the patient's history were reviewed and updated as appropriate: allergies, current medications, past family history, past medical history, past social history, past surgical history and problem list.   Health Maintenance:  Normal pap and negative HRHPV on 08-03-2019.    Review of Systems:  Pertinent items noted in HPI and remainder of comprehensive ROS otherwise negative.  Physical Exam:  BP 135/84   Pulse 78   Wt 278 lb (126.1 kg)   LMP 05/04/2020   Breastfeeding No   BMI 42.27 kg/m  CONSTITUTIONAL: Well-developed, well-nourished female in no acute distress.  HEENT:  Normocephalic, atraumatic. External right and left ear normal. No scleral icterus.  NECK: Normal range of motion, supple, no masses noted on observation SKIN: No rash noted. Not diaphoretic. No erythema. No pallor. MUSCULOSKELETAL: Normal range of motion. No edema noted. NEUROLOGIC: Alert and oriented to person, place, and time. Normal muscle tone coordination. No cranial nerve deficit noted. PSYCHIATRIC: Normal mood and affect. Normal behavior. Normal judgment and thought content. CARDIOVASCULAR: Normal heart rate noted RESPIRATORY: Effort and breath sounds normal, no problems with respiration noted ABDOMEN: No masses noted. No other overt distention noted.  Incision well approximated.  No drainage.  Non tender.  PELVIC: Deferred  Labs   Results for orders placed or performed during the hospital encounter of 02/08/21 (from the past 168 hour(s))  CBC   Collection Time: 02/09/21  6:33 AM  Result Value Ref Range   WBC 12.6 (H) 4.0 - 10.5 K/uL   RBC 3.63 (L) 3.87 - 5.11 MIL/uL   Hemoglobin 9.7 (L) 12.0 - 15.0 g/dL   HCT 15.4 (L) 00.8 - 67.6 %   MCV 83.7 80.0 - 100.0 fL   MCH 26.7 26.0 - 34.0 pg   MCHC 31.9 30.0 - 36.0 g/dL   RDW 19.5 (H) 09.3 - 26.7 %   Platelets 260 150 - 400 K/uL   nRBC 0.0 0.0 - 0.2 %     Assessment and Plan:    1. Encounter for postoperative wound care - doing well.  Incision healing well. - follow up in 3 weeks for Nexplanon Insertion   Routine preventative health maintenance measures emphasized. Please refer to After Visit Summary for other counseling recommendations.   Return in about 22 days (around 03/09/2021) for Nexplanon Insertion.    I spent 20 minutes dedicated to the care of this patient including pre-visit review of records, face to face time with the patient discussing her conditions and treatments and post visit orders.    Brock Bad, MD, FACOG Obstetrician & Gynecologist, [facility] for [facility], [facility] 02/15/21

## 2021-02-15 NOTE — Progress Notes (Signed)
Pt presents for incision check s/p c/s on 02/08/21. Nexplanon insertion scheduled 03/09/21.

## 2021-02-16 ENCOUNTER — Ambulatory Visit: Payer: No Typology Code available for payment source

## 2021-02-18 ENCOUNTER — Telehealth (HOSPITAL_COMMUNITY): Payer: Self-pay

## 2021-02-18 NOTE — Telephone Encounter (Signed)
No answer. Left message to return nurse call.  Marcelino Duster [facility] 02/18/2021,1647

## 2021-02-23 ENCOUNTER — Ambulatory Visit: Payer: No Typology Code available for payment source | Admitting: Obstetrics

## 2021-03-09 ENCOUNTER — Ambulatory Visit: Payer: No Typology Code available for payment source | Admitting: Obstetrics

## 2021-03-18 IMAGING — US US MFM OB DETAIL+14 WK
1 series · 13 of 28 positions shown · non-contrast
Comparison: none

[Series 1: us mfm ob detail+14 wk · 13 of 80 slices shown]
[im 3/80]
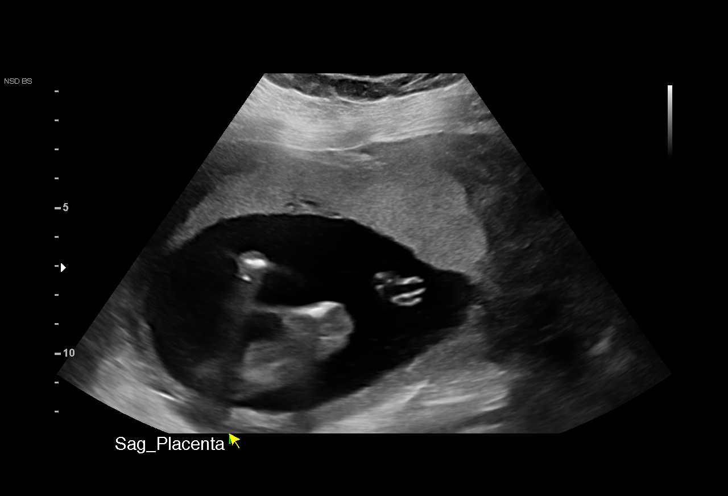
[im 9/80]
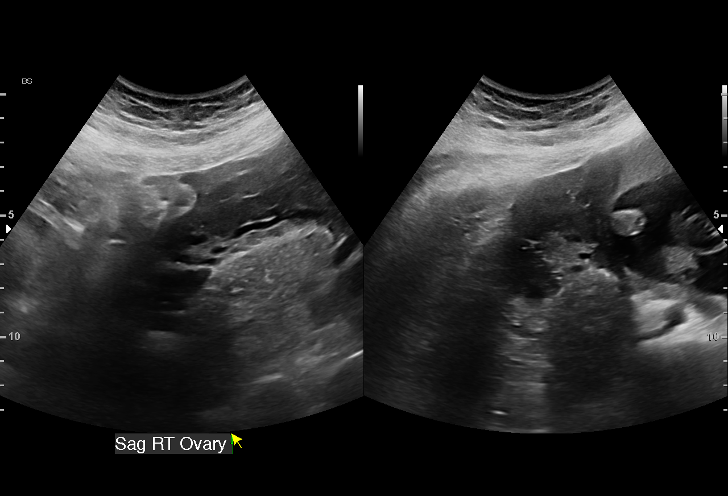
[im 15/80]
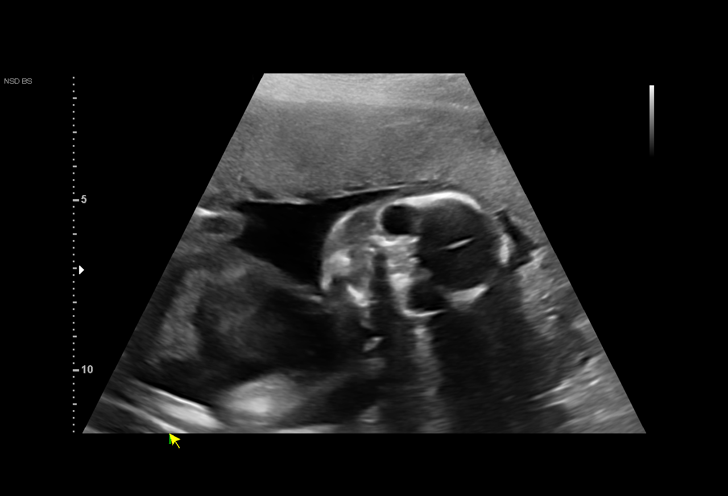
[im 21/80]
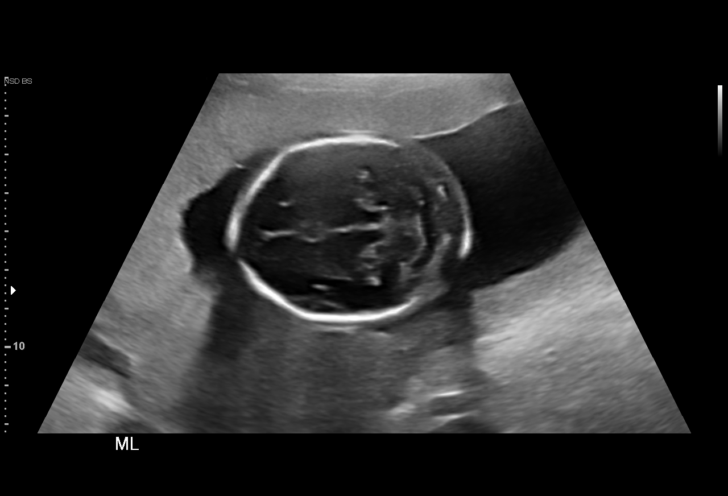
[im 27/80]
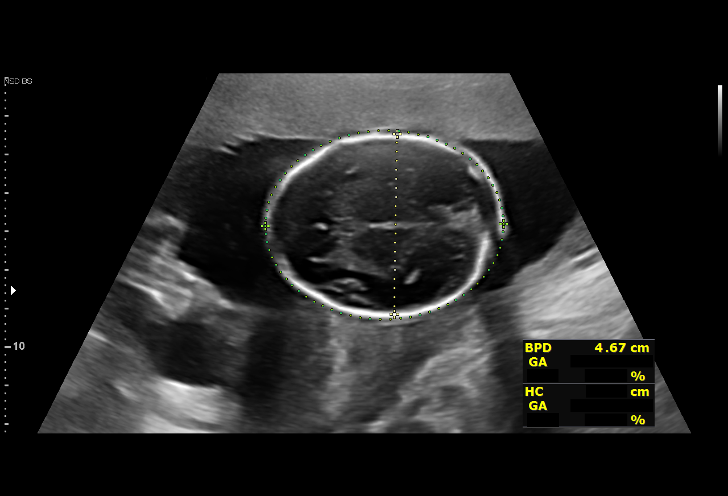
[im 33/80]
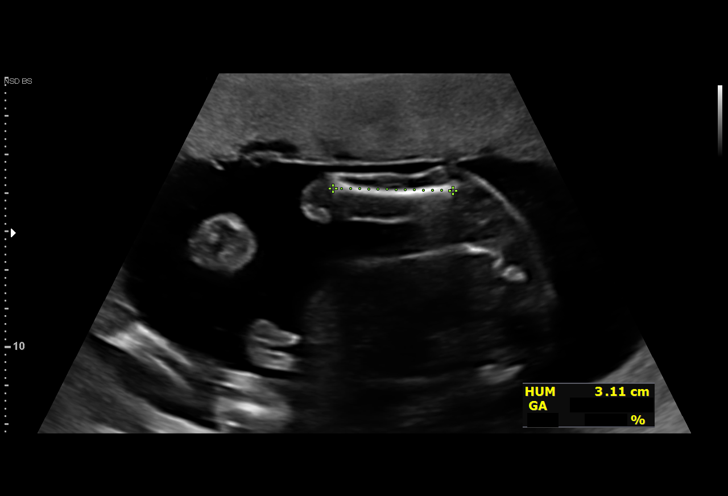
[im 41/80]
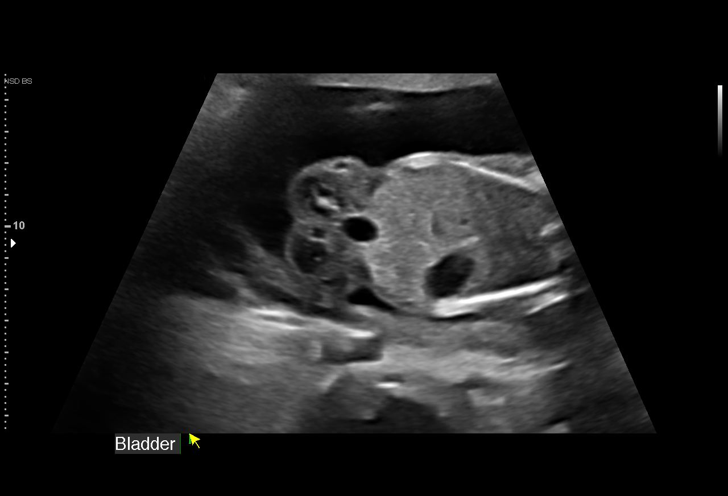
[im 47/80]
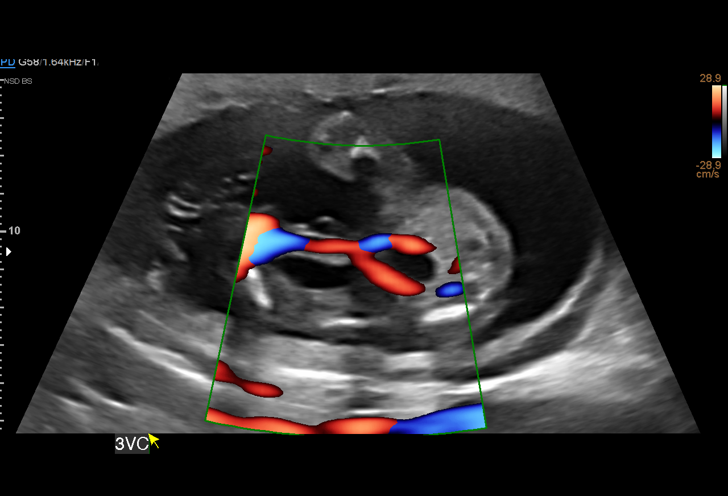
[im 53/80]
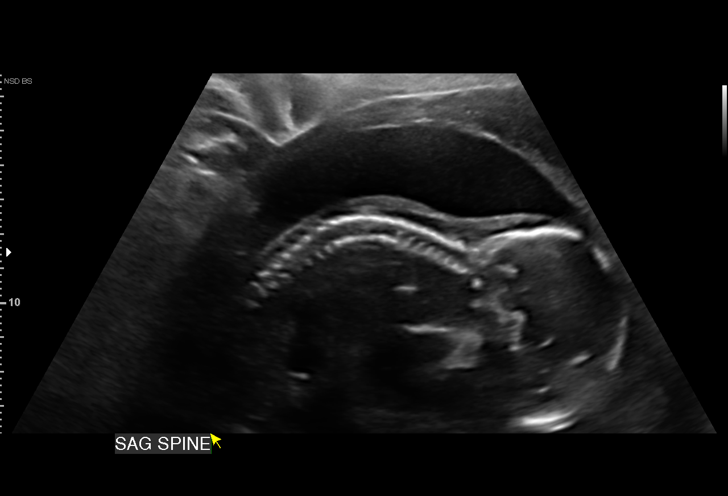
[im 59/80]
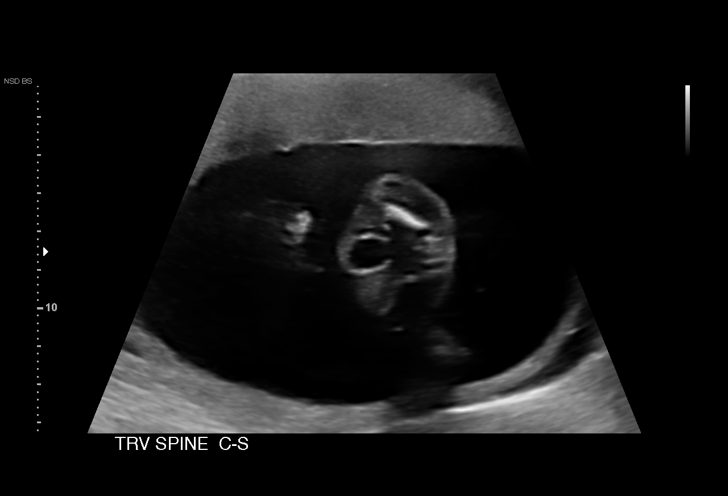
[im 65/80]
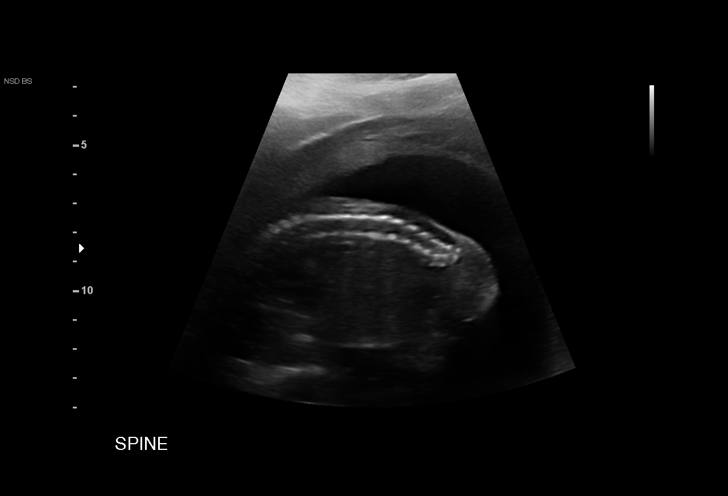
[im 71/80]
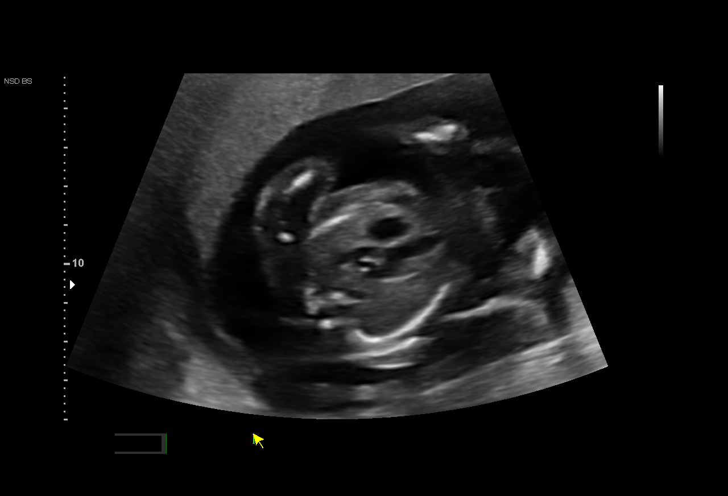
[im 77/80]
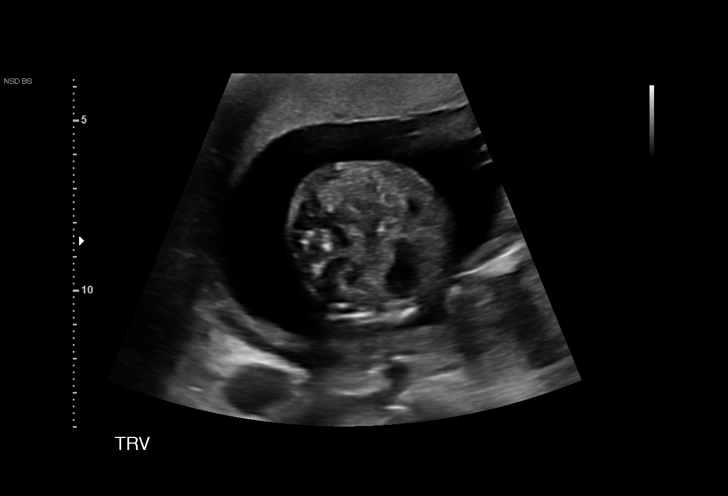

[13 of 28 positions shown; findings below may reference images not displayed]

Indications

 Encounter for antenatal screening for
 malformations
 19 weeks gestation of pregnancy
 Obesity complicating pregnancy (BMI 39)
 LR NIPS
 Negative Horizon
 Negative AFP
 Previous cesarean delivery, antepartum
Fetal Evaluation

 Num Of Fetuses:         1
 Fetal Heart Rate(bpm):  157
 Cardiac Activity:       Observed
 Presentation:           Cephalic
 Placenta:               Anterior
 P. Cord Insertion:      Visualized, central

 Amniotic Fluid
 AFI FV:      Within normal limits

                             Largest Pocket(cm)

Biometry
 BPD:      46.6  mm     G. Age:  20w 1d         89  %    CI:         73.5   %    70 - 86
                                                         FL/HC:      18.1   %    16.1 -
 HC:      172.7  mm     G. Age:  19w 6d         80  %    HC/AC:      1.08        1.09 -
 AC:      159.6  mm     G. Age:  21w 1d         96  %    FL/BPD:     67.0   %
 FL:       31.2  mm     G. Age:  19w 5d         69  %    FL/AC:      19.5   %    20 - 24
 HUM:      30.7  mm     G. Age:  20w 1d         80  %
 CER:      21.6  mm     G. Age:  20w 3d         92  %
 NFT:       4.6  mm
 LV:        5.8  mm
 CM:        5.9  mm

 Est. FW:     349  gm    0 lb 12 oz      99  %
OB History

 Gravidity:    4         Term:   2        Prem:   0        SAB:   0
 TOP:          1       Ectopic:  0        Living: 2
Gestational Age

 LMP:           19w 0d        Date:  05/04/20                 EDD:   02/08/21
 U/S Today:     20w 2d                                        EDD:   01/30/21
 Best:          19w 0d     Det. By:  LMP  (05/04/20)          EDD:   02/08/21
Anatomy

 Cranium:               Appears normal         LVOT:                   Appears normal
 Cavum:                 Appears normal         Aortic Arch:            Appears normal
 Ventricles:            Appears normal         Ductal Arch:            Appears normal
 Choroid Plexus:        Appears normal         Diaphragm:              Appears normal
 Cerebellum:            Appears normal         Stomach:                Appears normal, left
                                                                       sided
 Posterior Fossa:       Appears normal         Abdomen:                Appears normal
 Nuchal Fold:           Appears normal         Abdominal Wall:         Appears nml (cord
                                                                       insert, abd wall)
 Face:                  Orbits appear          Cord Vessels:           Appears normal (3
                        normal                                         vessel cord)
 Lips:                  Appears normal         Kidneys:                Appear normal
 Palate:                Not well visualized    Bladder:                Appears normal
 Thoracic:              Appears normal         Spine:                  Appears normal
 Heart:                 Appears normal         Upper Extremities:      Appears normal
                        (4CH, axis, and
                        situs)
 RVOT:                  Not well visualized    Lower Extremities:      Appears normal

 Other:  Fetus appears to be a male. 5th digit visualized. Open hands
         visualized.
Cervix Uterus Adnexa

 Cervix
 Length:           3.78  cm.
 Normal appearance by transabdominal scan.

 Right Ovary
 Within normal limits.
 Left Ovary
 Within normal limits.

 Adnexa
 No abnormality visualized.
Impression

 Single intrauterine pregnancy here for a detailed anatomy
 due to elevated BMI with previous cesarean delivery.
 Normal anatomy with measurements large for dates. She is
 dated by her LMP. Today she is measuring 1 week ahead,
 with a sure LMP as she was tracking her cycles.
 There is good fetal movement and amniotic fluid volume
 Suboptimal views of the fetal anatomy were obtained
 secondary to fetal position.

 She is taking low dose ASA for preeclampsia prevention.
Recommendations

 Follow up growth in 4-6 weeks to trend growth and reassess
 dating.

## 2021-03-27 ENCOUNTER — Encounter: Payer: Self-pay | Admitting: Obstetrics

## 2021-03-27 ENCOUNTER — Ambulatory Visit (INDEPENDENT_AMBULATORY_CARE_PROVIDER_SITE_OTHER): Payer: No Typology Code available for payment source | Admitting: Obstetrics

## 2021-03-27 ENCOUNTER — Other Ambulatory Visit: Payer: Self-pay

## 2021-03-27 VITALS — BP 121/82 | HR 75 | Ht 67.0 in | Wt 279.0 lb

## 2021-03-27 DIAGNOSIS — Z01812 Encounter for preprocedural laboratory examination: Secondary | ICD-10-CM | POA: Diagnosis not present

## 2021-03-27 DIAGNOSIS — Z30017 Encounter for initial prescription of implantable subdermal contraceptive: Secondary | ICD-10-CM

## 2021-03-27 LAB — POCT URINE PREGNANCY: Preg Test, Ur: NEGATIVE

## 2021-03-27 MED ORDER — ETONOGESTREL 68 MG ~~LOC~~ IMPL
68.0000 mg | DRUG_IMPLANT | Freq: Once | SUBCUTANEOUS | Status: AC
  Administered 2021-03-27: 68 mg via SUBCUTANEOUS

## 2021-03-27 NOTE — Progress Notes (Signed)
Nexplanon Procedure Note   PRE-OP DIAGNOSIS: Desired Long-Acting, Reversible Contraception ( LARC ) POST-OP DIAGNOSIS: Same  PROCEDURE: Nexplanon  Insertion Performing Provider: Bing Neighbors. Clearance Coots MD  Patient education prior to procedure, explained risk, benefits of Nexplanon, reviewed alternative options. Patient reported understanding. Gave consent to continue with procedure.   PROCEDURE:  Pregnancy Text :  Negative Site (check):      left arm         Sterile Preparation:   Betadinex3 Lot # [id number] Expiration Date 2024 JUL 03  Insertion site was selected 8 - 10 cm from medial epicondyle and marked along with guiding site using sterile marker. Procedure area was prepped and draped in a sterile fashion. 1% Lidocaine 1.5 ml given prior to procedure. Nexplanon  was inserted subcutaneously.Needle was removed from the insertion site. Nexplanon capsule was palpated by provider and patient to assure satisfactory placement. Dressing applied.  Followup: The patient tolerated the procedure well without complications.  Standard post-procedure care is explained and return precautions are given.  Brock Bad, MD 03/27/2021 4:55 PM

## 2021-03-27 NOTE — Progress Notes (Signed)
GYN presents for Nexplanon, 6 weeks PP. Last unprotected sex was 2 days ago.  LMP 03/25/2021.  UPT Today is NEGATIVE.

## 2021-03-28 ENCOUNTER — Encounter: Payer: Self-pay | Admitting: Obstetrics

## 2021-08-05 IMAGING — US US FETAL BPP W/ NON-STRESS
1 series · 14 of 15 positions shown · non-contrast
Comparison: none

[Series 1: us fetal bpp w/ non-stress · 15 acquisitions, 14 frames shown]
[im 1/15]
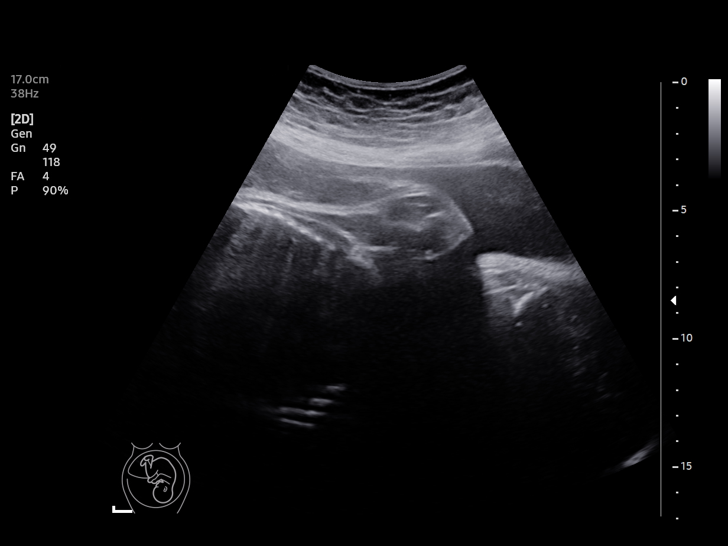
[im 2/15]
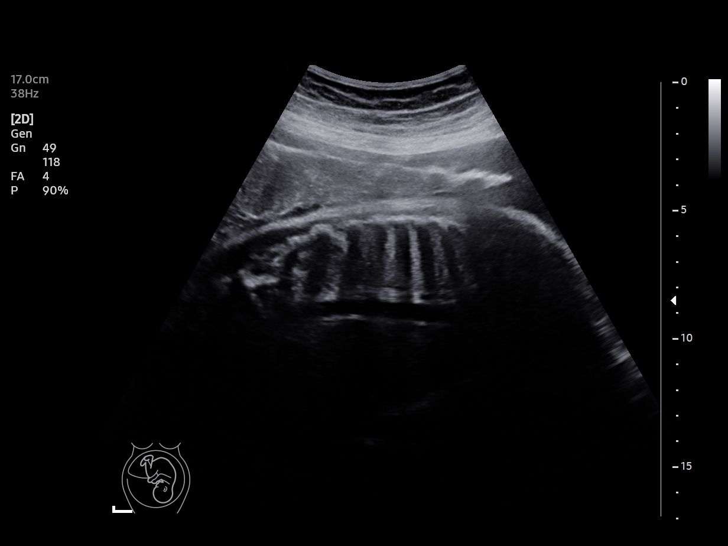
[im 3/15]
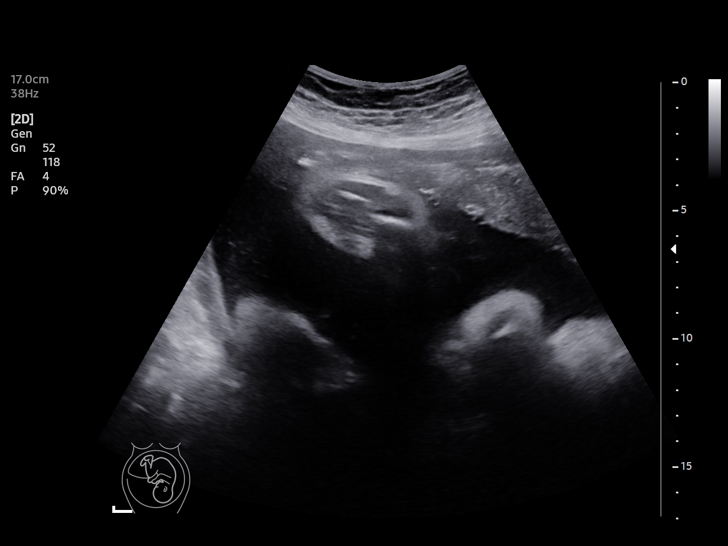
[im 4/15]
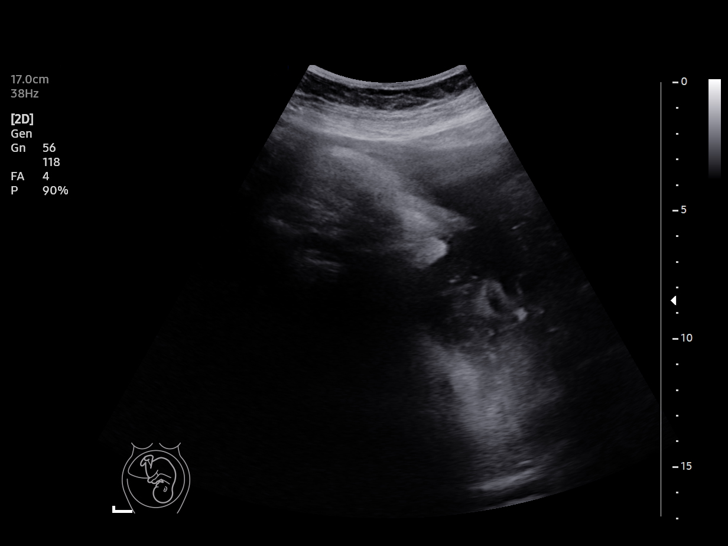
[im 5/15]
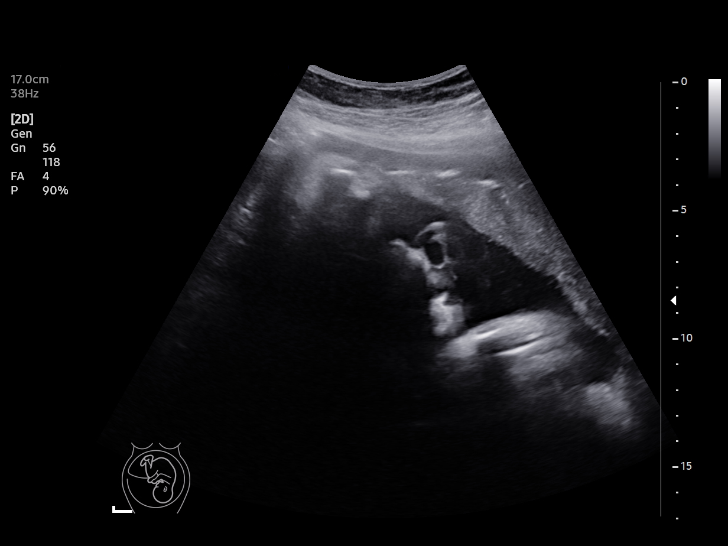
[im 6/15]
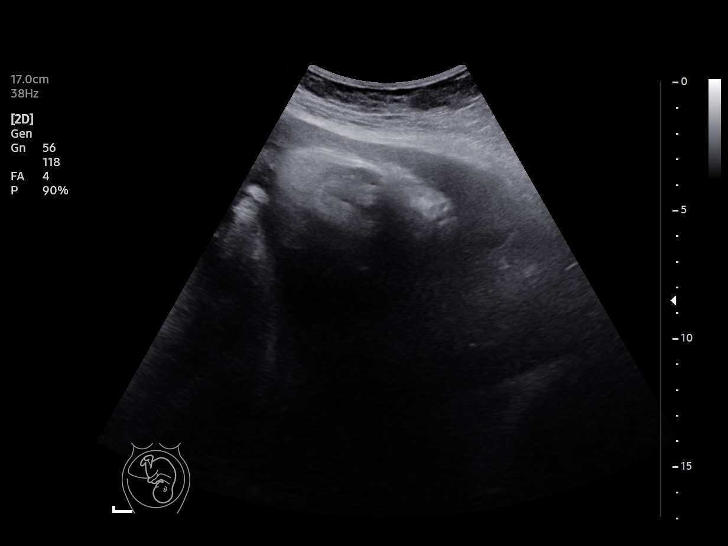
[im 7/15]
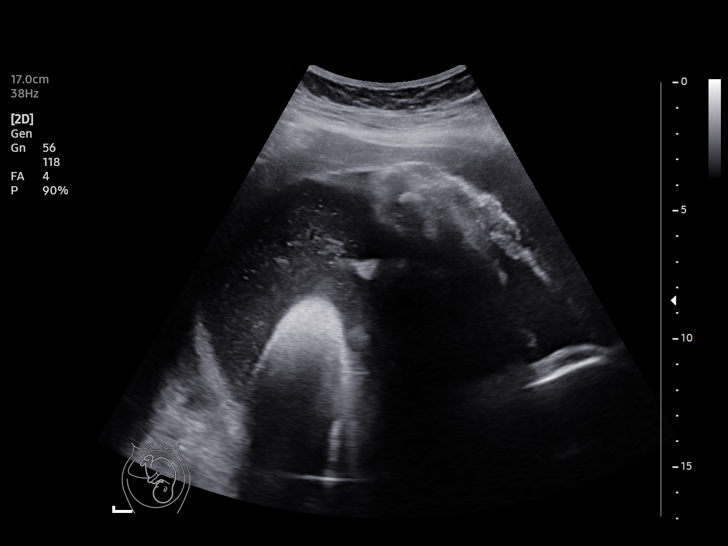
[im 9/15]
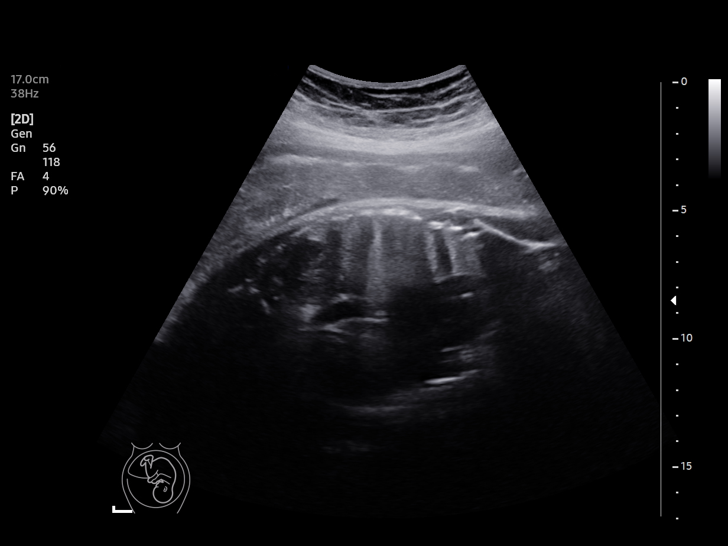
[im 10/15]
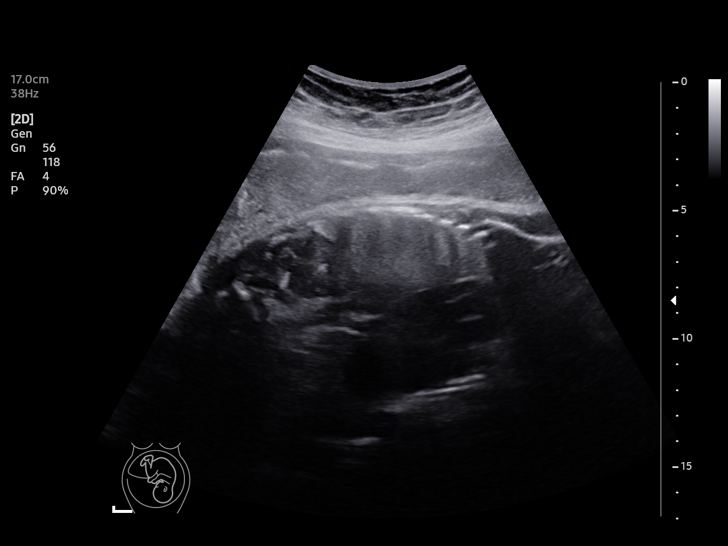
[im 11/15]
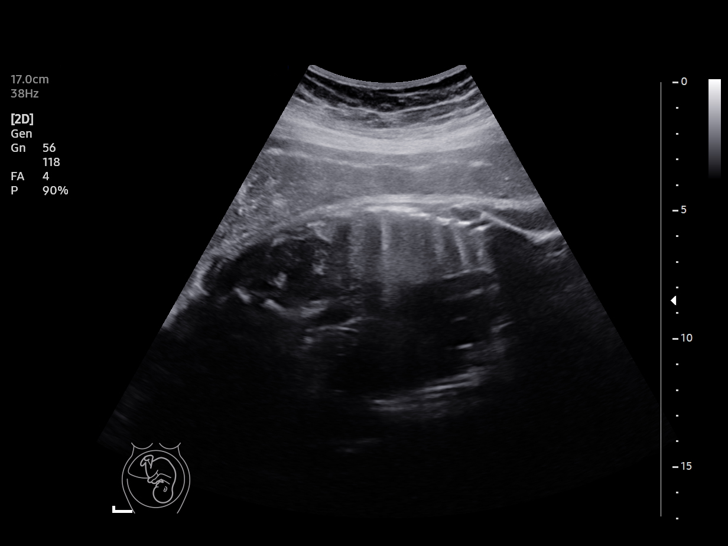
[im 12/15]
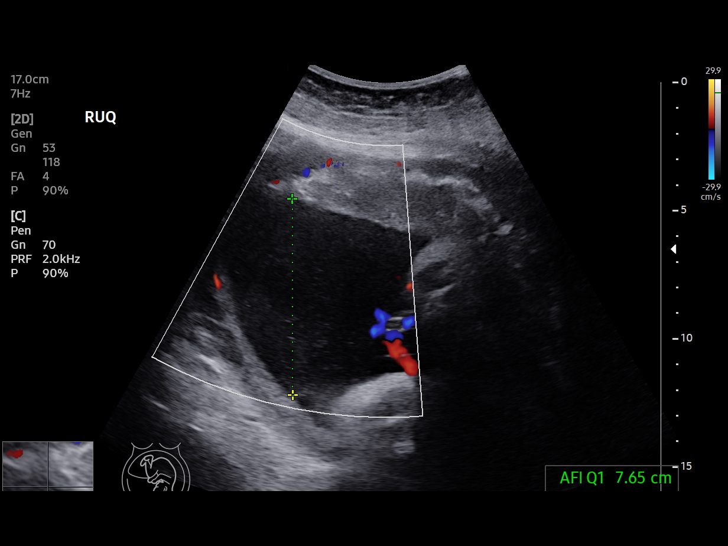
[im 13/15]
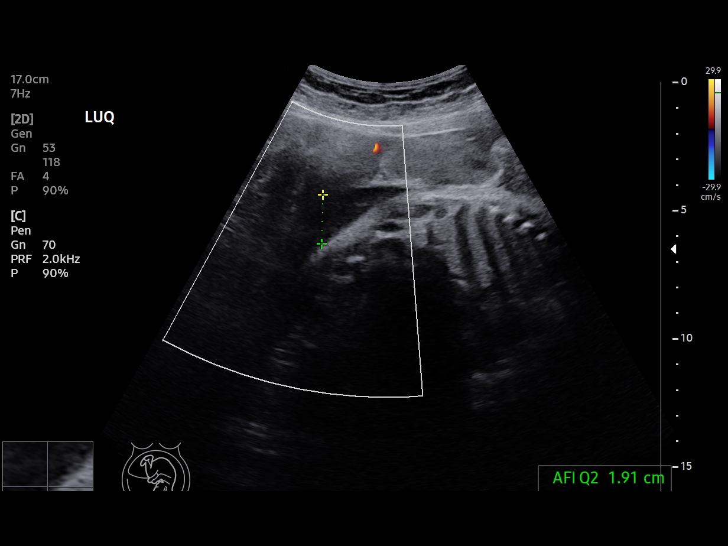
[im 14/15]
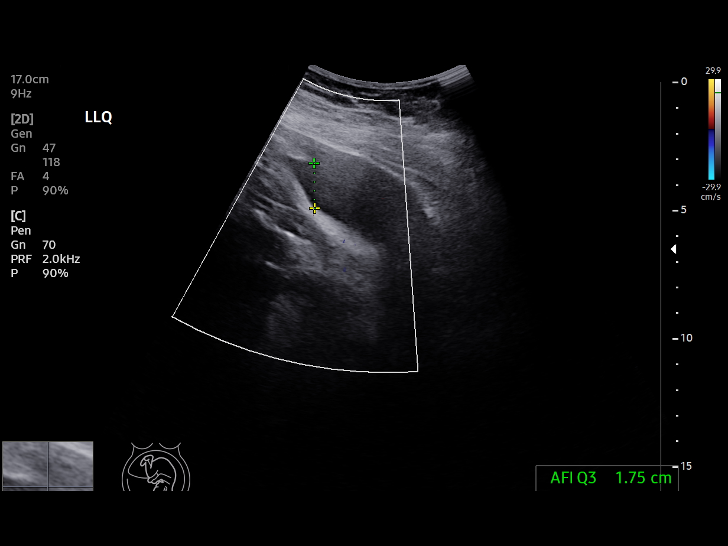
[im 15/15]
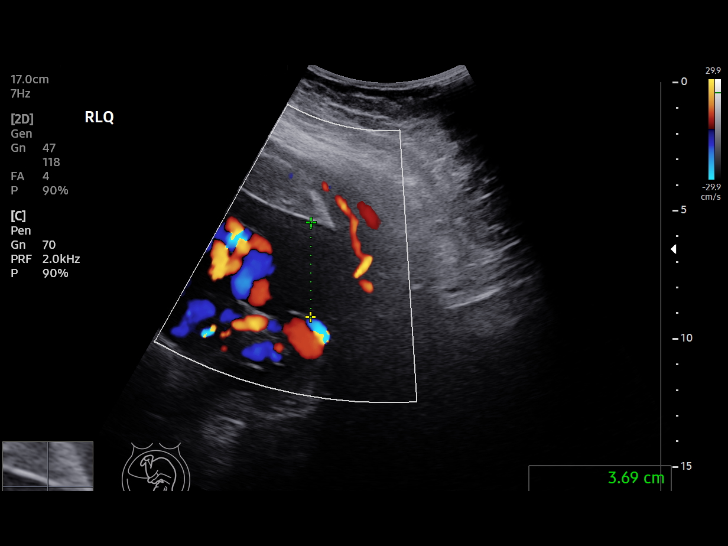

[14 of 15 positions shown; findings below may reference images not displayed]

[REDACTED]care at

 1  US FETAL BPP W/NONSTRESS              76818.4     [facility]

Service(s) Provided

Indications

 39 weeks gestation of pregnancy
 Obesity complicating pregnancy, third
 trimester
Fetal Evaluation

 Num Of Fetuses:         1
 Preg. Location:         Intrauterine
 Cardiac Activity:       Observed
 Presentation:           Cephalic

 Amniotic Fluid
 AFI FV:      Within normal limits

 AFI Sum(cm)     %Tile       Largest Pocket(cm)
 15              60

 RUQ(cm)       RLQ(cm)       LUQ(cm)        LLQ(cm)

Biophysical Evaluation
 Amniotic F.V:   Pocket => 2 cm             F. Tone:        Observed
 F. Movement:    Observed                   N.S.T:          Reactive
 F. Breathing:   Observed                   Score:          [DATE]
OB History

 Gravidity:    4         Term:   2        Prem:   0        SAB:   0
 TOP:          1       Ectopic:  0        Living: 2
Gestational Age

 LMP:           39w 0d        Date:  05/04/20                 EDD:   02/08/21
 Best:          39w 0d     Det. By:  LMP  (05/04/20)          EDD:   02/08/21
Impression

 Vertex
Recommendations

 Continue with weekly antenatal testing as indicated
